# Patient Record
Sex: Female | Born: 1950 | Race: White | Hispanic: No | Marital: Married | Smoking: Former smoker
Health system: Southern US, Community
[De-identification: ages and names within clinical notes are randomized; demographics above are authoritative.]

## PROBLEM LIST (undated history)

## (undated) DIAGNOSIS — F329 Major depressive disorder, single episode, unspecified: Secondary | ICD-10-CM

## (undated) DIAGNOSIS — Z933 Colostomy status: Secondary | ICD-10-CM

## (undated) DIAGNOSIS — S82143A Displaced bicondylar fracture of unspecified tibia, initial encounter for closed fracture: Secondary | ICD-10-CM

## (undated) DIAGNOSIS — L719 Rosacea, unspecified: Secondary | ICD-10-CM

## (undated) DIAGNOSIS — G35 Multiple sclerosis: Secondary | ICD-10-CM

## (undated) DIAGNOSIS — S82109A Unspecified fracture of upper end of unspecified tibia, initial encounter for closed fracture: Secondary | ICD-10-CM

## (undated) DIAGNOSIS — R0602 Shortness of breath: Secondary | ICD-10-CM

## (undated) DIAGNOSIS — C801 Malignant (primary) neoplasm, unspecified: Secondary | ICD-10-CM

## (undated) DIAGNOSIS — Z9359 Other cystostomy status: Secondary | ICD-10-CM

## (undated) DIAGNOSIS — G830 Diplegia of upper limbs: Secondary | ICD-10-CM

## (undated) DIAGNOSIS — N210 Calculus in bladder: Secondary | ICD-10-CM

## (undated) DIAGNOSIS — E678 Other specified hyperalimentation: Secondary | ICD-10-CM

## (undated) DIAGNOSIS — IMO0001 Reserved for inherently not codable concepts without codable children: Secondary | ICD-10-CM

## (undated) DIAGNOSIS — I739 Peripheral vascular disease, unspecified: Secondary | ICD-10-CM

## (undated) DIAGNOSIS — M1990 Unspecified osteoarthritis, unspecified site: Secondary | ICD-10-CM

## (undated) DIAGNOSIS — G4730 Sleep apnea, unspecified: Secondary | ICD-10-CM

## (undated) DIAGNOSIS — M810 Age-related osteoporosis without current pathological fracture: Secondary | ICD-10-CM

## (undated) DIAGNOSIS — E785 Hyperlipidemia, unspecified: Secondary | ICD-10-CM

## (undated) DIAGNOSIS — H5713 Ocular pain, bilateral: Secondary | ICD-10-CM

## (undated) DIAGNOSIS — R3129 Other microscopic hematuria: Secondary | ICD-10-CM

## (undated) DIAGNOSIS — R609 Edema, unspecified: Secondary | ICD-10-CM

## (undated) DIAGNOSIS — N390 Urinary tract infection, site not specified: Secondary | ICD-10-CM

## (undated) DIAGNOSIS — F32A Depression, unspecified: Secondary | ICD-10-CM

## (undated) DIAGNOSIS — R110 Nausea: Secondary | ICD-10-CM

## (undated) DIAGNOSIS — G4700 Insomnia, unspecified: Secondary | ICD-10-CM

## (undated) DIAGNOSIS — E119 Type 2 diabetes mellitus without complications: Secondary | ICD-10-CM

## (undated) DIAGNOSIS — G8250 Quadriplegia, unspecified: Secondary | ICD-10-CM

## (undated) HISTORY — DX: Displaced bicondylar fracture of unspecified tibia, initial encounter for closed fracture: S82.143A

## (undated) HISTORY — DX: Multiple sclerosis: G35

## (undated) HISTORY — DX: Other microscopic hematuria: R31.29

## (undated) HISTORY — PX: TUBAL LIGATION: SHX77

## (undated) HISTORY — DX: Other specified hyperalimentation: E67.8

## (undated) HISTORY — DX: Unspecified fracture of upper end of unspecified tibia, initial encounter for closed fracture: S82.109A

## (undated) HISTORY — DX: Reserved for inherently not codable concepts without codable children: IMO0001

## (undated) HISTORY — DX: Quadriplegia, unspecified: G82.50

## (undated) HISTORY — DX: Edema, unspecified: R60.9

## (undated) HISTORY — DX: Type 2 diabetes mellitus without complications: E11.9

## (undated) HISTORY — DX: Rosacea, unspecified: L71.9

## (undated) HISTORY — DX: Hyperlipidemia, unspecified: E78.5

---

## 1979-08-09 HISTORY — PX: GASTRIC RESTRICTION SURGERY: SHX653

## 2000-08-08 HISTORY — PX: SUPRAPUBIC CATHETER INSERTION: SUR719

## 2003-08-09 HISTORY — PX: COLOSTOMY: SHX63

## 2005-09-20 ENCOUNTER — Emergency Department: Payer: Self-pay | Admitting: Emergency Medicine

## 2005-12-15 ENCOUNTER — Emergency Department: Payer: Self-pay | Admitting: Emergency Medicine

## 2006-02-01 ENCOUNTER — Emergency Department: Payer: Self-pay | Admitting: Unknown Physician Specialty

## 2006-11-23 ENCOUNTER — Emergency Department: Payer: Self-pay | Admitting: Internal Medicine

## 2007-01-30 ENCOUNTER — Emergency Department: Payer: Self-pay | Admitting: Emergency Medicine

## 2008-03-30 ENCOUNTER — Inpatient Hospital Stay: Payer: Self-pay | Admitting: General Surgery

## 2008-03-30 ENCOUNTER — Other Ambulatory Visit: Payer: Self-pay

## 2008-05-28 ENCOUNTER — Ambulatory Visit: Payer: Self-pay | Admitting: Internal Medicine

## 2008-06-04 ENCOUNTER — Ambulatory Visit: Payer: Self-pay | Admitting: Neurology

## 2008-12-19 ENCOUNTER — Ambulatory Visit: Payer: Self-pay | Admitting: Neurology

## 2009-06-02 ENCOUNTER — Ambulatory Visit: Payer: Self-pay | Admitting: Internal Medicine

## 2010-05-21 ENCOUNTER — Inpatient Hospital Stay: Payer: Self-pay | Admitting: Orthopedic Surgery

## 2010-07-06 ENCOUNTER — Encounter: Payer: Self-pay | Admitting: Orthopedic Surgery

## 2010-09-19 ENCOUNTER — Emergency Department: Payer: Self-pay | Admitting: Emergency Medicine

## 2010-12-15 ENCOUNTER — Encounter: Payer: Self-pay | Admitting: Orthopedic Surgery

## 2011-01-07 ENCOUNTER — Encounter: Payer: Self-pay | Admitting: Orthopedic Surgery

## 2011-02-05 ENCOUNTER — Ambulatory Visit: Payer: Self-pay | Admitting: Specialist

## 2011-07-18 ENCOUNTER — Emergency Department: Payer: Self-pay | Admitting: Emergency Medicine

## 2011-11-04 ENCOUNTER — Emergency Department: Payer: Self-pay | Admitting: Emergency Medicine

## 2011-11-04 LAB — URINALYSIS, COMPLETE
Bilirubin,UR: NEGATIVE
Blood: NEGATIVE
Glucose,UR: NEGATIVE mg/dL (ref 0–75)
Ketone: NEGATIVE
Nitrite: POSITIVE
Ph: 7 (ref 4.5–8.0)
Squamous Epithelial: <1
WBC UR: 23 /HPF (ref 0–5)

## 2011-11-08 DIAGNOSIS — H04129 Dry eye syndrome of unspecified lacrimal gland: Secondary | ICD-10-CM | POA: Insufficient documentation

## 2011-11-28 ENCOUNTER — Emergency Department: Payer: Self-pay | Admitting: Emergency Medicine

## 2011-11-28 LAB — URINALYSIS, COMPLETE
Blood: NEGATIVE
Hyaline Cast: 8
Ketone: NEGATIVE
Nitrite: POSITIVE
Ph: 5 (ref 4.5–8.0)
Protein: NEGATIVE
RBC,UR: 47 /HPF (ref 0–5)
Specific Gravity: 1.008 (ref 1.003–1.030)
Squamous Epithelial: 3

## 2011-11-28 LAB — CBC
HCT: 40.4 % (ref 35.0–47.0)
HGB: 13.1 g/dL (ref 12.0–16.0)
MCH: 29.6 pg (ref 26.0–34.0)
MCV: 92 fL (ref 80–100)
Platelet: 289 10*3/uL (ref 150–440)
WBC: 12.4 10*3/uL — ABNORMAL HIGH (ref 3.6–11.0)

## 2011-11-28 LAB — COMPREHENSIVE METABOLIC PANEL
Alkaline Phosphatase: 79 U/L (ref 50–136)
Bilirubin,Total: 0.4 mg/dL (ref 0.2–1.0)
Chloride: 103 mmol/L (ref 98–107)
Co2: 25 mmol/L (ref 21–32)
Creatinine: 0.59 mg/dL — ABNORMAL LOW (ref 0.60–1.30)
EGFR (Non-African Amer.): >60
Osmolality: 282 (ref 275–301)
Potassium: 3.9 mmol/L (ref 3.5–5.1)
Total Protein: 6.6 g/dL (ref 6.4–8.2)

## 2011-11-28 LAB — WET PREP, GENITAL

## 2011-11-30 LAB — URINE CULTURE

## 2011-12-23 DIAGNOSIS — L719 Rosacea, unspecified: Secondary | ICD-10-CM

## 2011-12-23 DIAGNOSIS — H01009 Unspecified blepharitis unspecified eye, unspecified eyelid: Secondary | ICD-10-CM | POA: Insufficient documentation

## 2011-12-23 HISTORY — DX: Rosacea, unspecified: L71.9

## 2012-02-01 ENCOUNTER — Other Ambulatory Visit: Payer: Self-pay | Admitting: Urology

## 2012-02-01 ENCOUNTER — Encounter (HOSPITAL_COMMUNITY): Payer: Self-pay | Admitting: Pharmacy Technician

## 2012-02-02 ENCOUNTER — Inpatient Hospital Stay (HOSPITAL_COMMUNITY): Admission: RE | Admit: 2012-02-02 | Payer: Self-pay | Source: Ambulatory Visit

## 2012-02-02 ENCOUNTER — Encounter (HOSPITAL_COMMUNITY): Payer: Self-pay

## 2012-02-02 ENCOUNTER — Encounter (HOSPITAL_COMMUNITY)
Admission: RE | Admit: 2012-02-02 | Discharge: 2012-02-02 | Disposition: A | Discharge status: Home / Self Care | Payer: Managed Care, Other (non HMO) | Source: Ambulatory Visit | Attending: Urology | Admitting: Urology

## 2012-02-02 ENCOUNTER — Ambulatory Visit (HOSPITAL_COMMUNITY)
Admission: RE | Admit: 2012-02-02 | Discharge: 2012-02-02 | Disposition: A | Discharge status: Home / Self Care | Payer: Managed Care, Other (non HMO) | Source: Ambulatory Visit | Attending: Urology | Admitting: Urology

## 2012-02-02 DIAGNOSIS — Z01818 Encounter for other preprocedural examination: Secondary | ICD-10-CM | POA: Insufficient documentation

## 2012-02-02 DIAGNOSIS — Z01812 Encounter for preprocedural laboratory examination: Secondary | ICD-10-CM | POA: Insufficient documentation

## 2012-02-02 HISTORY — DX: Unspecified osteoarthritis, unspecified site: M19.90

## 2012-02-02 HISTORY — DX: Calculus in bladder: N21.0

## 2012-02-02 HISTORY — DX: Sleep apnea, unspecified: G47.30

## 2012-02-02 HISTORY — DX: Multiple sclerosis: G35

## 2012-02-02 HISTORY — DX: Colostomy status: Z93.3

## 2012-02-02 HISTORY — DX: Malignant (primary) neoplasm, unspecified: C80.1

## 2012-02-02 HISTORY — DX: Diplegia of upper limbs: G83.0

## 2012-02-02 HISTORY — DX: Ocular pain, bilateral: H57.13

## 2012-02-02 HISTORY — DX: Depression, unspecified: F32.A

## 2012-02-02 HISTORY — DX: Major depressive disorder, single episode, unspecified: F32.9

## 2012-02-02 HISTORY — DX: Peripheral vascular disease, unspecified: I73.9

## 2012-02-02 HISTORY — DX: Insomnia, unspecified: G47.00

## 2012-02-02 HISTORY — DX: Rosacea, unspecified: L71.9

## 2012-02-02 HISTORY — DX: Age-related osteoporosis without current pathological fracture: M81.0

## 2012-02-02 HISTORY — DX: Nausea: R11.0

## 2012-02-02 HISTORY — DX: Edema, unspecified: R60.9

## 2012-02-02 HISTORY — DX: Other cystostomy status: Z93.59

## 2012-02-02 HISTORY — DX: Shortness of breath: R06.02

## 2012-02-02 HISTORY — DX: Urinary tract infection, site not specified: N39.0

## 2012-02-02 LAB — CBC
HCT: 44.3 % (ref 36.0–46.0)
MCV: 90.2 fL (ref 78.0–100.0)
Platelets: 330 10*3/uL (ref 150–400)
RBC: 4.91 MIL/uL (ref 3.87–5.11)
WBC: 12.7 10*3/uL — ABNORMAL HIGH (ref 4.0–10.5)

## 2012-02-02 LAB — BASIC METABOLIC PANEL
BUN: 13 mg/dL (ref 6–23)
CO2: 25 mEq/L (ref 19–32)
Chloride: 99 mEq/L (ref 96–112)
Creatinine, Ser: 0.46 mg/dL — ABNORMAL LOW (ref 0.50–1.10)

## 2012-02-02 LAB — SURGICAL PCR SCREEN: Staphylococcus aureus: INVALID — AB

## 2012-02-02 NOTE — Progress Notes (Signed)
02/02/12 1427  OBSTRUCTIVE SLEEP APNEA  Have you ever been diagnosed with sleep apnea through a sleep study? No  Do you snore loudly (loud enough to be heard through closed doors)?  1  Do you often feel tired, fatigued, or sleepy during the daytime? 1  Has anyone observed you stop breathing during your sleep? 0  Do you have, or are you being treated for high blood pressure? 0  BMI more than 35 kg/m2? 1  Age over 59 years old? 1  Neck circumference greater than 40 cm/18 inches? 0  Gender: 0  Obstructive Sleep Apnea Score 4   Score 4 or greater  Updated health history;Results sent to PCP

## 2012-02-02 NOTE — Progress Notes (Signed)
02/02/12 1450  OBSTRUCTIVE SLEEP APNEA  Have you ever been diagnosed with sleep apnea through a sleep study? No  Do you snore loudly (loud enough to be heard through closed doors)?  1  Do you often feel tired, fatigued, or sleepy during the daytime? 1  Has anyone observed you stop breathing during your sleep? 0  Do you have, or are you being treated for high blood pressure? 0  BMI more than 35 kg/m2? 1  Age over 59 years old? 1  Neck circumference greater than 40 cm/18 inches? 0  Gender: 0  Obstructive Sleep Apnea Score 4   Score 4 or greater  Updated health history;Results sent to PCP

## 2012-02-02 NOTE — H&P (Signed)
History of Present Illness     Beth [name] is seen today at [facility] request.  She has been having recurrent urinary tract infections for the past 3 months.  She was treated with several antibiotics and the infections recur soon after she finishes taking the antibiotics.  She also has vaginal itching secondary to yeasts infection.  She has Beth and has had an S/P tube since 2002.  She has seen blood in her urine and has been having suprapubic and back pain.  Since she has had the S/P tube for several years I advised her to have cystoscopy.  It revealed at least 3 bladder calculi.  I explained to Beth [name] that she will probably continue to have UTI as long as she has the S/P tube.  She needs to be treated only if she is symptomatic.  She needs cystolitholapaxy to remove the bladder stones.  The procedure, risks, benefits were explained to her.  The risks include but re not limited to hemorrhage, infection, bladder injury.  She understands and wishes to proceed.   Past Medical History Problems  1. History of  Arthritis V13.4 2. History of  Depression 311 3. History of  Hypercholesterolemia 272.0 4. History of  Multiple Sclerosis 340 5. History of  Sinus Arrhythmia 427.9  Surgical History Problems  1. History of  Colostomy 2. History of  Suprapubic Cystostomy  Current Meds 1. Advil TABS; Therapy: (Recorded:24Jun2013) to 2. Ampyra 10 MG Oral Tablet Extended Release 12 Hour; Therapy: (Recorded:24Jun2013) to 3. Cranberry TABS; Therapy: (Recorded:24Jun2013) to 4. Cyclobenzaprine HCl 5 MG Oral Tablet; Therapy: (Recorded:24Jun2013) to 5. Cymbalta 60 MG Oral Capsule Delayed Release Particles; Therapy: (Recorded:24Jun2013) to 6. Evista 60 MG Oral Tablet; Therapy: (Recorded:24Jun2013) to 7. Fish Oil CAPS; Therapy: (Recorded:24Jun2013) to 8. Lasix 80 MG Oral Tablet; Therapy: (Recorded:24Jun2013) to 9. Provigil 200 MG Oral Tablet; Therapy: (Recorded:24Jun2013) to 10. Stool Softener CAPS;  Therapy: (Recorded:24Jun2013) to 11. TraZODone HCl 50 MG Oral Tablet; Therapy: (Recorded:24Jun2013) to 12. Vitamin C TABS; Therapy: (Recorded:24Jun2013) to  Allergies Medication  1. No Known Drug Allergies  Family History Problems  1. Paternal history of  Asthma V17.5 2. Family history of  Family Health Status Number Of Children 3. Family history of  Father Deceased At Age 54 4. Family history of  Mother Deceased At Age ____ 5. Maternal grandfather's history of  Transient Ischemic Attack 6. Family history of  Tuberculosis  Social History Problems    Alcohol Use   Caffeine Use   Marital History - Currently Married   Tobacco Use 305.1  Review of Systems Genitourinary, constitutional, skin, eye, otolaryngeal, hematologic/lymphatic, cardiovascular, pulmonary, endocrine, musculoskeletal, gastrointestinal, neurological and psychiatric system(s) were reviewed and pertinent findings if present are noted.  Genitourinary: dysuria and hematuria.  Gastrointestinal: nausea, abdominal pain and constipation.  Constitutional: feeling tired (fatigue).  Integumentary: skin rash/lesion and pruritus.  Hematologic/Lymphatic: a tendency to easily bruise.  Cardiovascular: leg swelling.  Respiratory: shortness of breath.  Psychiatric: depression and anxiety.    Vitals Vital Signs [Data Includes: Last 1 Day]  24Jun2013 10:47AM  BMI Calculated: 34.86 BSA Calculated: 2.17 Height: 5 ft 8 in Weight: 230 lb  Blood Pressure: 155 / 85 Temperature: 98.1 F Heart Rate: 83 Respiration: 18  Physical Exam Constitutional: Well nourished and well developed . No acute distress.  ENT:. The ears and nose are normal in appearance.  Neck: The appearance of the neck is normal and no neck mass is present.  Pulmonary: No respiratory distress and normal  respiratory rhythm and effort.  Cardiovascular: Heart rate and rhythm are normal . No peripheral edema.  Abdomen: The abdomen is soft and nontender. No  masses are palpated. No CVA tenderness. No hernias are palpable. No hepatosplenomegaly noted.  Genitourinary:  Chaperone Present: .  Examination of the external genitalia shows normal female external genitalia and no lesions. The urethra is normal in appearance and not tender. There is no urethral mass. Vaginal exam demonstrates no abnormalities. The adnexa are palpably normal. The bladder is non tender and not distended. The anus is normal on inspection. The perineum is normal on inspection.  Lymphatics: The femoral and inguinal nodes are not enlarged or tender.  Skin: Normal skin turgor, no visible rash and no visible skin lesions.  Neuro/Psych:. Mood and affect are appropriate.  Unable to move lower extremities.    Procedure  Procedure: Cystoscopy   Indication: Frequent UTI.  Informed Consent: Risks, benefits, and potential adverse events were discussed and informed consent was obtained from the patient . Specific risks including, but not limited to bleeding, infection, pain, allergic reaction etc. were explained.  Prep: The patient was prepped with betadine.  Procedure Note:  The cystoscope was passed through the cystostomy tract.  Bladder: Visulization was clear. Multiple stones were identified in the bladder. The patient tolerated the procedure well.    Assessment Assessed  1. Vaginal Candidiasis 112.1 2. Bladder Calculus 594.1 3. Multiple Sclerosis 340 4. Urinary Tract Infection 599.0  Plan Vaginal Candidiasis (112.1)  1. Fluconazole 150 MG Oral Tablet; TAKE 1 TABLET 1 TIME ONLY; Therapy: 24Jun2013 to  (Evaluate:25Jun2013)  Requested for: 24Jun2013; Last Rx:24Jun2013; Edited   Diflucan 150 mgm x 1.  She needs cystolitholapaxy.     Signatures  CC: Dr Marisue Ivan  Electronically signed by : Su Grand, M.D.; Jan 30 2012  7:00PM

## 2012-02-02 NOTE — Patient Instructions (Signed)
20 Beth [name]  02/02/2012   Your procedure is scheduled on:  02/03/12 AT 9:00 AM  Report to SHORT STAY DEPT  at 6:30  AM.  Call this number if you have problems the morning of surgery: [phone]   Remember:   Do not eat food or drink liquids AFTER MIDNIGHT    Take these medicines the morning of surgery with A SIP OF WATER: AMPYRA / EVISTA / CYMBALTA   Do not wear jewelry, make-up or nail polish.  Do not wear lotions, powders, or perfumes.   Do not shave legs or underarms 48 hrs. before surgery (men may shave face)  Do not bring valuables to the hospital.  Contacts, dentures or bridgework may not be worn into surgery.  Leave suitcase in the car. After surgery it may be brought to your room.  For patients admitted to the hospital, checkout time is 11:00 AM the day of discharge.   Patients discharged the day of surgery will not be allowed to drive home.    Special Instructions:   Please read over the following fact sheets that you were given: MRSA  Information               SHOWER WITH BETASEPT THE NIGHT BEFORE SURGERY AND THE MORNING OF SURGERY

## 2012-02-03 ENCOUNTER — Ambulatory Visit (HOSPITAL_COMMUNITY)
Admission: RE | Admit: 2012-02-03 | Discharge: 2012-02-03 | Disposition: A | Discharge status: Home / Self Care | Payer: Managed Care, Other (non HMO) | Source: Ambulatory Visit | Attending: Urology | Admitting: Urology

## 2012-02-03 ENCOUNTER — Encounter (HOSPITAL_COMMUNITY): Payer: Self-pay | Admitting: *Deleted

## 2012-02-03 ENCOUNTER — Encounter (HOSPITAL_COMMUNITY): Payer: Self-pay

## 2012-02-03 ENCOUNTER — Encounter (HOSPITAL_COMMUNITY)
Admission: RE | Disposition: A | Discharge status: Home / Self Care | Payer: Self-pay | Source: Ambulatory Visit | Attending: Urology

## 2012-02-03 ENCOUNTER — Ambulatory Visit (HOSPITAL_COMMUNITY): Payer: Managed Care, Other (non HMO) | Admitting: *Deleted

## 2012-02-03 DIAGNOSIS — E7800 Pure hypercholesterolemia, unspecified: Secondary | ICD-10-CM | POA: Insufficient documentation

## 2012-02-03 DIAGNOSIS — Z79899 Other long term (current) drug therapy: Secondary | ICD-10-CM | POA: Insufficient documentation

## 2012-02-03 DIAGNOSIS — Z01818 Encounter for other preprocedural examination: Secondary | ICD-10-CM | POA: Insufficient documentation

## 2012-02-03 DIAGNOSIS — N210 Calculus in bladder: Secondary | ICD-10-CM | POA: Insufficient documentation

## 2012-02-03 DIAGNOSIS — R0602 Shortness of breath: Secondary | ICD-10-CM | POA: Insufficient documentation

## 2012-02-03 DIAGNOSIS — N390 Urinary tract infection, site not specified: Secondary | ICD-10-CM | POA: Insufficient documentation

## 2012-02-03 DIAGNOSIS — G35 Multiple sclerosis: Secondary | ICD-10-CM | POA: Insufficient documentation

## 2012-02-03 HISTORY — PX: CYSTOSCOPY: SHX5120

## 2012-02-03 SURGERY — CYSTOSCOPY
Anesthesia: General | Wound class: Clean Contaminated

## 2012-02-03 MED ORDER — ONDANSETRON HCL 4 MG/2ML IJ SOLN
INTRAMUSCULAR | Status: DC | PRN
  Administered 2012-02-03: 4 mg via INTRAVENOUS

## 2012-02-03 MED ORDER — ACETAMINOPHEN 10 MG/ML IV SOLN
INTRAVENOUS | Status: DC | PRN
  Administered 2012-02-03: 1000 mg via INTRAVENOUS

## 2012-02-03 MED ORDER — FENTANYL CITRATE 0.05 MG/ML IJ SOLN
25.0000 ug | INTRAMUSCULAR | Status: DC | PRN
  Administered 2012-02-03: 50 ug via INTRAVENOUS

## 2012-02-03 MED ORDER — 0.9 % SODIUM CHLORIDE (POUR BTL) OPTIME
TOPICAL | Status: DC | PRN
  Administered 2012-02-03: 1000 mL

## 2012-02-03 MED ORDER — MIDAZOLAM HCL 5 MG/5ML IJ SOLN
INTRAMUSCULAR | Status: DC | PRN
  Administered 2012-02-03 (×2): 1 mg via INTRAVENOUS

## 2012-02-03 MED ORDER — CEFAZOLIN SODIUM-DEXTROSE 2-3 GM-% IV SOLR
2.0000 g | Freq: Once | INTRAVENOUS | Status: AC
  Administered 2012-02-03: 2 g via INTRAVENOUS

## 2012-02-03 MED ORDER — PROMETHAZINE HCL 25 MG/ML IJ SOLN: 6.2500 mg | INTRAMUSCULAR | Status: DC | PRN

## 2012-02-03 MED ORDER — MEPERIDINE HCL 50 MG/ML IJ SOLN: 6.2500 mg | INTRAMUSCULAR | Status: DC | PRN

## 2012-02-03 MED ORDER — ACETAMINOPHEN 10 MG/ML IV SOLN
INTRAVENOUS | Status: AC
  Filled 2012-02-03: qty 100

## 2012-02-03 MED ORDER — STERILE WATER FOR IRRIGATION IR SOLN
Status: DC | PRN
  Administered 2012-02-03: 3000 mL

## 2012-02-03 MED ORDER — FENTANYL CITRATE 0.05 MG/ML IJ SOLN
INTRAMUSCULAR | Status: AC
  Filled 2012-02-03: qty 2

## 2012-02-03 MED ORDER — LIDOCAINE HCL (CARDIAC) 20 MG/ML IV SOLN
INTRAVENOUS | Status: DC | PRN
  Administered 2012-02-03: 80 mg via INTRAVENOUS

## 2012-02-03 MED ORDER — CEFAZOLIN SODIUM-DEXTROSE 2-3 GM-% IV SOLR
INTRAVENOUS | Status: AC
  Filled 2012-02-03: qty 50

## 2012-02-03 MED ORDER — PROPOFOL 10 MG/ML IV BOLUS
INTRAVENOUS | Status: DC | PRN
  Administered 2012-02-03: 160 mg via INTRAVENOUS

## 2012-02-03 MED ORDER — LACTATED RINGERS IV SOLN: INTRAVENOUS | Status: DC

## 2012-02-03 MED ORDER — LACTATED RINGERS IV SOLN
INTRAVENOUS | Status: DC
  Administered 2012-02-03: 09:00:00 via INTRAVENOUS
  Administered 2012-02-03: 1000 mL via INTRAVENOUS

## 2012-02-03 MED ORDER — FENTANYL CITRATE 0.05 MG/ML IJ SOLN
INTRAMUSCULAR | Status: DC | PRN
  Administered 2012-02-03 (×2): 50 ug via INTRAVENOUS

## 2012-02-03 SURGICAL SUPPLY — 19 items
BAG URO CATCHER STRL LF (DRAPE) ×2 IMPLANT
BASKET ZERO TIP NITINOL 2.4FR (BASKET) ×6 IMPLANT
CATH FOLEY 2WAY SLVR  5CC 22FR (CATHETERS) ×1
CATH FOLEY 2WAY SLVR 5CC 22FR (CATHETERS) ×1 IMPLANT
CATH ROBINSON RED A/P 16FR (CATHETERS) IMPLANT
CATH URET 5FR 28IN OPEN ENDED (CATHETERS) IMPLANT
CLOTH BEACON ORANGE TIMEOUT ST (SAFETY) ×2 IMPLANT
DRAPE CAMERA CLOSED 9X96 (DRAPES) ×2 IMPLANT
GLOVE BIOGEL M 7.0 STRL (GLOVE) ×2 IMPLANT
GLOVE SURG SS PI 8.0 STRL IVOR (GLOVE) IMPLANT
GOWN PREVENTION PLUS XLARGE (GOWN DISPOSABLE) IMPLANT
GOWN STRL NON-REIN LRG LVL3 (GOWN DISPOSABLE) ×2 IMPLANT
GOWN STRL REIN XL XLG (GOWN DISPOSABLE) ×2 IMPLANT
LASER FIBER DISP (UROLOGICAL SUPPLIES) ×2 IMPLANT
MANIFOLD NEPTUNE II (INSTRUMENTS) ×2 IMPLANT
MARKER SKIN DUAL TIP RULER LAB (MISCELLANEOUS) IMPLANT
PACK CYSTO (CUSTOM PROCEDURE TRAY) ×2 IMPLANT
SYRINGE IRR TOOMEY STRL 70CC (SYRINGE) ×2 IMPLANT
TUBING CONNECTING 10 (TUBING) ×2 IMPLANT

## 2012-02-03 NOTE — Anesthesia Postprocedure Evaluation (Signed)
  Anesthesia Post-op Note  Patient: Beth [name]  Procedure(s) Performed: Procedure(s) (LRB): CYSTOSCOPY (N/A) HOLMIUM LASER APPLICATION (N/A)  Patient Location: PACU  Anesthesia Type: General  Level of Consciousness: awake and alert   Airway and Oxygen Therapy: Patient Spontanous Breathing  Post-op Pain: mild  Post-op Assessment: Post-op Vital signs reviewed, Patient's Cardiovascular Status Stable, Respiratory Function Stable, Patent Airway and No signs of Nausea or vomiting  Post-op Vital Signs: stable  Complications: No apparent anesthesia complications

## 2012-02-03 NOTE — Op Note (Signed)
Seymone Forlenza is a 61 y.o.   02/03/2012  Preop diagnosis: Bladder calculi, recurrent urinary tract infections  Postop diagnosis: Same  Procedure done: Cystoscopy, holmium laser of bladder calculi, stone extraction  Surgeon: Wendie Simmer. Tajuanna Burnett  Anesthesia: General  Indication: Patient is a 61 years old female with a history of multiple sclerosis. She has a suprapubic catheter that has been exchanged on a regular basis since 2002. For the past several months and she has been having recurrent urinary tract infections. Cystoscopy through the cystostomy site revealed 3 bladder calculi. She is scheduled today for stones extraction.  Procedure: Patient was identified by her wrist band and proper timeout was taken.  Under general anesthesia she was prepped and draped and placed in the dorsolithotomy position. The cystostomy catheter was removed. A flexible cystoscope was passed through the cystostomy. The larger bladder calculus was grasped within the wires of a Nitinol basket but could not be extracted. With a 365 microfiber holmium laser the stone was fragmented in smaller fragments and the fragments were removed. Another stone was also too large to pass through the cystostomy site. It was fragmented with the holmium laser and the fragments were removed. The smaller stone was removed with the Nitinol basket. All larger stone fragments were removed through the cystostomy. A #22 French Foley catheter was then reinserted in the bladder through the cystostomy. A cystoscope was then passed through the urethra and all the smaller stone fragments were removed with a Toomey syringe. There was no remaining stone fragment in the bladder at the end of the procedure.  EBL: Minimal  The patient tolerated the procedure well and left the OR in satisfactory condition to postanesthesia care unit.  CC: Dr. Cindra Eves

## 2012-02-03 NOTE — Discharge Instructions (Signed)
Suprapubic Catheter Replacement Care After Refer to this sheet in the next few weeks. These instructions provide you with information on caring for yourself after changing your catheter. Your caregiver may also give you more specific instructions. Call your caregiver if you have any problems or questions after you change your catheter. HOME CARE INSTRUCTIONS   Take all medicines prescribed by your caregiver. Follow the directions carefully.   Drink 8 glasses of water every day. This produces good urine flow.   Check the skin around your catheter a few times every day. Watch for redness and swelling. Look for any fluids coming out of the opening.   Do not use powder or cream around the catheter opening.   Do not take tub baths or use pools or hot tubs.   Keep all follow-up appointments.  SEEK MEDICAL CARE IF:   You leak urine.   Your skin around the catheter becomes red or sore.   Your urine flow slows down.   Your urine gets cloudy or smelly.  SEEK IMMEDIATE MEDICAL CARE IF:   You have chills, nausea, or back pain.   You have trouble changing your catheter.   Your catheter comes out.   You have blood in your urine.   You have no urine flow for 1 hour.   You have a fever.  Document Released: 04/12/2011 Document Revised: 07/14/2011 Document Reviewed: 04/12/2011 [facility] Patient Information 2012 [place], [place].Cystoscopy (Bladder Exam) Care After Refer to this sheet in the next few weeks. These discharge instructions provide you with general information on caring for yourself after you leave the hospital. Your caregiver may also give you specific instructions. Your treatment has been planned according to the most current medical practices available, but unavoidable complications sometimes occur. If you have any problems or questions after discharge, please call your caregiver. AFTER THE PROCEDURE   There may be temporary bleeding and burning with urination.   Drink enough  water and fluids to keep your urine clear or pale yellow.  FINDING OUT THE RESULTS OF YOUR TEST Not all test results are available during your visit. If your test results are not back during the visit, make an appointment with your caregiver to find out the results. Do not assume everything is normal if you have not heard from your caregiver or the medical facility. It is important for you to follow up on all of your test results. SEEK IMMEDIATE MEDICAL CARE IF:   There is an increase in blood in the urine or you are passing clots.   There is difficulty passing urine.   You develop the chills.   You have an oral temperature above 102 F (38.9 C), not controlled by medicine.   Belly (abdominal) pain develops.  Document Released: 02/11/2005 Document Revised: 07/14/2011 Document Reviewed: 12/10/2007 [facility] Patient Information 2012 [place], [place].

## 2012-02-03 NOTE — Transfer of Care (Signed)
Immediate Anesthesia Transfer of Care Note  Patient: Beth [name]  Procedure(s) Performed: Procedure(s) (LRB): CYSTOSCOPY (N/A) HOLMIUM LASER APPLICATION (N/A)  Patient Location: PACU  Anesthesia Type: General  Level of Consciousness: awake, alert  and oriented  Airway & Oxygen Therapy: Patient Spontanous Breathing and Patient connected to face mask oxygen  Post-op Assessment: Report given to PACU RN and Post -op Vital signs reviewed and stable  Post vital signs: Reviewed and stable  Complications: No apparent anesthesia complications

## 2012-02-03 NOTE — Anesthesia Preprocedure Evaluation (Signed)
Anesthesia Evaluation  Patient identified by MRN, date of birth, ID band Patient awake    Reviewed: Allergy & Precautions, H&P , NPO status , Patient's Chart, lab work & pertinent test results  Airway Mallampati: II TM Distance: >3 FB Neck ROM: Full    Dental No notable dental hx.    Pulmonary neg pulmonary ROS,  breath sounds clear to auscultation  Pulmonary exam normal       Cardiovascular negative cardio ROS  Rhythm:Regular Rate:Normal     Neuro/Psych PSYCHIATRIC DISORDERS Depression Severe MS. Unable to control extremities.   negative neurological ROS  negative psych ROS   GI/Hepatic negative GI ROS, Neg liver ROS, Difficulty swallowing secondary to MS   Endo/Other  negative endocrine ROSMorbid obesity  Renal/GU negative Renal ROS  negative genitourinary   Musculoskeletal negative musculoskeletal ROS (+)   Abdominal   Peds negative pediatric ROS (+)  Hematology negative hematology ROS (+)   Anesthesia Other Findings   Reproductive/Obstetrics negative OB ROS                           Anesthesia Physical Anesthesia Plan  ASA: III  Anesthesia Plan: General   Post-op Pain Management:    Induction: Intravenous, Rapid sequence and Cricoid pressure planned  Airway Management Planned: Oral ETT  Additional Equipment:   Intra-op Plan:   Post-operative Plan: Extubation in OR  Informed Consent: I have reviewed the patients History and Physical, chart, labs and discussed the procedure including the risks, benefits and alternatives for the proposed anesthesia with the patient or authorized representative who has indicated his/her understanding and acceptance.   Dental advisory given  Plan Discussed with: CRNA  Anesthesia Plan Comments:         Anesthesia Quick Evaluation

## 2012-02-05 LAB — MRSA CULTURE

## 2012-02-06 ENCOUNTER — Encounter (HOSPITAL_COMMUNITY): Payer: Self-pay | Admitting: Urology

## 2012-04-24 ENCOUNTER — Ambulatory Visit: Payer: Self-pay | Admitting: Family Medicine

## 2012-05-08 ENCOUNTER — Ambulatory Visit: Payer: Self-pay | Admitting: Family Medicine

## 2012-06-26 ENCOUNTER — Ambulatory Visit: Payer: Self-pay | Admitting: Family Medicine

## 2012-12-28 DIAGNOSIS — N950 Postmenopausal bleeding: Secondary | ICD-10-CM | POA: Insufficient documentation

## 2012-12-28 DIAGNOSIS — N898 Other specified noninflammatory disorders of vagina: Secondary | ICD-10-CM | POA: Insufficient documentation

## 2013-01-07 DIAGNOSIS — E119 Type 2 diabetes mellitus without complications: Secondary | ICD-10-CM

## 2013-01-07 HISTORY — DX: Type 2 diabetes mellitus without complications: E11.9

## 2013-01-09 DIAGNOSIS — R9389 Abnormal findings on diagnostic imaging of other specified body structures: Secondary | ICD-10-CM | POA: Insufficient documentation

## 2013-01-29 ENCOUNTER — Ambulatory Visit: Payer: Self-pay | Admitting: Neurology

## 2013-08-28 ENCOUNTER — Ambulatory Visit: Payer: Self-pay | Admitting: Otolaryngology

## 2013-09-04 ENCOUNTER — Encounter: Payer: Self-pay | Admitting: Otolaryngology

## 2013-09-18 ENCOUNTER — Ambulatory Visit: Payer: Self-pay | Admitting: Otolaryngology

## 2013-10-18 ENCOUNTER — Inpatient Hospital Stay: Payer: Self-pay | Admitting: Family Medicine

## 2013-10-18 LAB — BASIC METABOLIC PANEL
Anion Gap: 3 — ABNORMAL LOW (ref 7–16)
BUN: 14 mg/dL (ref 7–18)
CALCIUM: 9.4 mg/dL (ref 8.5–10.1)
CHLORIDE: 99 mmol/L (ref 98–107)
Co2: 32 mmol/L (ref 21–32)
Creatinine: 1.35 mg/dL — ABNORMAL HIGH (ref 0.60–1.30)
GFR CALC AF AMER: 49 — AB
GFR CALC NON AF AMER: 43 — AB
Glucose: 107 mg/dL — ABNORMAL HIGH (ref 65–99)
Osmolality: 269 (ref 275–301)
Potassium: 3.3 mmol/L — ABNORMAL LOW (ref 3.5–5.1)
SODIUM: 134 mmol/L — AB (ref 136–145)

## 2013-10-18 LAB — CBC
HCT: 41.2 % (ref 35.0–47.0)
HGB: 13.1 g/dL (ref 12.0–16.0)
MCH: 28.9 pg (ref 26.0–34.0)
MCHC: 31.7 g/dL — ABNORMAL LOW (ref 32.0–36.0)
MCV: 91 fL (ref 80–100)
PLATELETS: 315 10*3/uL (ref 150–440)
RBC: 4.53 10*6/uL (ref 3.80–5.20)
RDW: 13.4 % (ref 11.5–14.5)
WBC: 14.9 10*3/uL — AB (ref 3.6–11.0)

## 2013-10-18 LAB — PRO B NATRIURETIC PEPTIDE: B-TYPE NATIURETIC PEPTID: 167 pg/mL — AB (ref 0–125)

## 2013-10-18 LAB — URINALYSIS, COMPLETE
Bilirubin,UR: NEGATIVE
GLUCOSE, UR: NEGATIVE mg/dL (ref 0–75)
Ketone: NEGATIVE
NITRITE: NEGATIVE
PROTEIN: NEGATIVE
Ph: 5 (ref 4.5–8.0)
RBC,UR: 14 /HPF (ref 0–5)
SPECIFIC GRAVITY: 1.018 (ref 1.003–1.030)

## 2013-10-18 LAB — TROPONIN I
Troponin-I: <0.02 ng/mL
Troponin-I: <0.02 ng/mL

## 2013-10-18 LAB — LIPASE, BLOOD: LIPASE: 112 U/L (ref 73–393)

## 2013-10-18 LAB — HEPATIC FUNCTION PANEL A (ARMC)
ALBUMIN: 3.6 g/dL (ref 3.4–5.0)
Alkaline Phosphatase: 66 U/L
BILIRUBIN DIRECT: 0.1 mg/dL (ref 0.00–0.20)
Bilirubin,Total: 0.5 mg/dL (ref 0.2–1.0)
SGOT(AST): 16 U/L (ref 15–37)
SGPT (ALT): 21 U/L (ref 12–78)
TOTAL PROTEIN: 7.7 g/dL (ref 6.4–8.2)

## 2013-10-19 LAB — CBC WITH DIFFERENTIAL/PLATELET
BASOS ABS: 0 10*3/uL (ref 0.0–0.1)
Basophil %: 0.3 %
EOS ABS: 0.1 10*3/uL (ref 0.0–0.7)
Eosinophil %: 0.6 %
HCT: 35.5 % (ref 35.0–47.0)
HGB: 12 g/dL (ref 12.0–16.0)
Lymphocyte #: 2.3 10*3/uL (ref 1.0–3.6)
Lymphocyte %: 20.4 %
MCH: 30.6 pg (ref 26.0–34.0)
MCHC: 33.8 g/dL (ref 32.0–36.0)
MCV: 90 fL (ref 80–100)
MONOS PCT: 11.7 %
Monocyte #: 1.3 x10 3/mm — ABNORMAL HIGH (ref 0.2–0.9)
NEUTROS ABS: 7.7 10*3/uL — AB (ref 1.4–6.5)
NEUTROS PCT: 67 %
PLATELETS: 254 10*3/uL (ref 150–440)
RBC: 3.93 10*6/uL (ref 3.80–5.20)
RDW: 13.4 % (ref 11.5–14.5)
WBC: 11.5 10*3/uL — AB (ref 3.6–11.0)

## 2013-10-19 LAB — BASIC METABOLIC PANEL
ANION GAP: 8 (ref 7–16)
BUN: 13 mg/dL (ref 7–18)
CALCIUM: 8 mg/dL — AB (ref 8.5–10.1)
Chloride: 103 mmol/L (ref 98–107)
Co2: 25 mmol/L (ref 21–32)
Creatinine: 0.92 mg/dL (ref 0.60–1.30)
Glucose: 86 mg/dL (ref 65–99)
OSMOLALITY: 271 (ref 275–301)
POTASSIUM: 3.2 mmol/L — AB (ref 3.5–5.1)
SODIUM: 136 mmol/L (ref 136–145)

## 2013-10-20 LAB — CBC WITH DIFFERENTIAL/PLATELET
BASOS ABS: 0 10*3/uL (ref 0.0–0.1)
BASOS PCT: 0.2 %
Eosinophil #: 0 10*3/uL (ref 0.0–0.7)
Eosinophil %: 0.3 %
HCT: 35.9 % (ref 35.0–47.0)
HGB: 11.6 g/dL — ABNORMAL LOW (ref 12.0–16.0)
LYMPHS PCT: 11.9 %
Lymphocyte #: 2.2 10*3/uL (ref 1.0–3.6)
MCH: 29.6 pg (ref 26.0–34.0)
MCHC: 32.3 g/dL (ref 32.0–36.0)
MCV: 92 fL (ref 80–100)
MONOS PCT: 11.3 %
Monocyte #: 2.1 x10 3/mm — ABNORMAL HIGH (ref 0.2–0.9)
NEUTROS PCT: 76.3 %
Neutrophil #: 13.9 10*3/uL — ABNORMAL HIGH (ref 1.4–6.5)
Platelet: 271 10*3/uL (ref 150–440)
RBC: 3.92 10*6/uL (ref 3.80–5.20)
RDW: 13.3 % (ref 11.5–14.5)
WBC: 18.2 10*3/uL — ABNORMAL HIGH (ref 3.6–11.0)

## 2013-10-20 LAB — BASIC METABOLIC PANEL
ANION GAP: 6 — AB (ref 7–16)
BUN: 7 mg/dL (ref 7–18)
CALCIUM: 8.4 mg/dL — AB (ref 8.5–10.1)
CHLORIDE: 107 mmol/L (ref 98–107)
CO2: 25 mmol/L (ref 21–32)
CREATININE: 0.86 mg/dL (ref 0.60–1.30)
GLUCOSE: 136 mg/dL — AB (ref 65–99)
OSMOLALITY: 276 (ref 275–301)
Potassium: 3.5 mmol/L (ref 3.5–5.1)
Sodium: 138 mmol/L (ref 136–145)

## 2013-10-21 LAB — COMPREHENSIVE METABOLIC PANEL
ALT: 16 U/L (ref 12–78)
ANION GAP: 6 — AB (ref 7–16)
Albumin: 2.1 g/dL — ABNORMAL LOW (ref 3.4–5.0)
Alkaline Phosphatase: 55 U/L
BILIRUBIN TOTAL: 0.2 mg/dL (ref 0.2–1.0)
BUN: 8 mg/dL (ref 7–18)
CALCIUM: 8.4 mg/dL — AB (ref 8.5–10.1)
CHLORIDE: 111 mmol/L — AB (ref 98–107)
Co2: 22 mmol/L (ref 21–32)
Creatinine: 0.77 mg/dL (ref 0.60–1.30)
Glucose: 186 mg/dL — ABNORMAL HIGH (ref 65–99)
Osmolality: 281 (ref 275–301)
POTASSIUM: 4.3 mmol/L (ref 3.5–5.1)
SGOT(AST): 11 U/L — ABNORMAL LOW (ref 15–37)
SODIUM: 139 mmol/L (ref 136–145)
Total Protein: 5.9 g/dL — ABNORMAL LOW (ref 6.4–8.2)

## 2013-10-21 LAB — CBC WITH DIFFERENTIAL/PLATELET
BASOS PCT: 0.1 %
Basophil #: 0 10*3/uL (ref 0.0–0.1)
EOS ABS: 0 10*3/uL (ref 0.0–0.7)
Eosinophil %: 0 %
HCT: 33.2 % — AB (ref 35.0–47.0)
HGB: 11.1 g/dL — ABNORMAL LOW (ref 12.0–16.0)
Lymphocyte #: 1.1 10*3/uL (ref 1.0–3.6)
Lymphocyte %: 7.4 %
MCH: 30.5 pg (ref 26.0–34.0)
MCHC: 33.4 g/dL (ref 32.0–36.0)
MCV: 91 fL (ref 80–100)
MONOS PCT: 6.4 %
Monocyte #: 1 x10 3/mm — ABNORMAL HIGH (ref 0.2–0.9)
NEUTROS ABS: 13.3 10*3/uL — AB (ref 1.4–6.5)
NEUTROS PCT: 86.1 %
Platelet: 264 10*3/uL (ref 150–440)
RBC: 3.65 10*6/uL — ABNORMAL LOW (ref 3.80–5.20)
RDW: 13.2 % (ref 11.5–14.5)
WBC: 15.4 10*3/uL — AB (ref 3.6–11.0)

## 2013-10-22 LAB — CBC WITH DIFFERENTIAL/PLATELET
BASOS PCT: 0 %
Basophil #: 0 10*3/uL (ref 0.0–0.1)
EOS ABS: 0 10*3/uL (ref 0.0–0.7)
Eosinophil %: 0 %
HCT: 32.2 % — ABNORMAL LOW (ref 35.0–47.0)
HGB: 11 g/dL — AB (ref 12.0–16.0)
LYMPHS ABS: 1.4 10*3/uL (ref 1.0–3.6)
Lymphocyte %: 9.6 %
MCH: 31.2 pg (ref 26.0–34.0)
MCHC: 34.3 g/dL (ref 32.0–36.0)
MCV: 91 fL (ref 80–100)
Monocyte #: 1.1 x10 3/mm — ABNORMAL HIGH (ref 0.2–0.9)
Monocyte %: 7.7 %
NEUTROS ABS: 12.4 10*3/uL — AB (ref 1.4–6.5)
NEUTROS PCT: 82.7 %
Platelet: 297 10*3/uL (ref 150–440)
RBC: 3.53 10*6/uL — AB (ref 3.80–5.20)
RDW: 13.3 % (ref 11.5–14.5)
WBC: 15 10*3/uL — AB (ref 3.6–11.0)

## 2013-10-22 LAB — BASIC METABOLIC PANEL
Anion Gap: 4 — ABNORMAL LOW (ref 7–16)
BUN: 10 mg/dL (ref 7–18)
CHLORIDE: 112 mmol/L — AB (ref 98–107)
Calcium, Total: 8.6 mg/dL (ref 8.5–10.1)
Co2: 24 mmol/L (ref 21–32)
Creatinine: 0.85 mg/dL (ref 0.60–1.30)
GLUCOSE: 213 mg/dL — AB (ref 65–99)
OSMOLALITY: 285 (ref 275–301)
Potassium: 4.6 mmol/L (ref 3.5–5.1)
SODIUM: 140 mmol/L (ref 136–145)

## 2013-10-23 LAB — CULTURE, BLOOD (SINGLE)

## 2013-10-25 LAB — CULTURE, BLOOD (SINGLE)

## 2013-11-21 LAB — URINE CULTURE

## 2013-12-09 ENCOUNTER — Emergency Department: Payer: Self-pay | Admitting: Emergency Medicine

## 2013-12-09 LAB — COMPREHENSIVE METABOLIC PANEL
ALK PHOS: 90 U/L
AST: 30 U/L (ref 15–37)
Albumin: 2.2 g/dL — ABNORMAL LOW (ref 3.4–5.0)
Anion Gap: 8 (ref 7–16)
BUN: 13 mg/dL (ref 7–18)
Bilirubin,Total: 0.3 mg/dL (ref 0.2–1.0)
CALCIUM: 8.8 mg/dL (ref 8.5–10.1)
Chloride: 99 mmol/L (ref 98–107)
Co2: 30 mmol/L (ref 21–32)
Creatinine: 0.83 mg/dL (ref 0.60–1.30)
EGFR (African American): >60
Glucose: 91 mg/dL (ref 65–99)
Osmolality: 274 (ref 275–301)
POTASSIUM: 3.2 mmol/L — AB (ref 3.5–5.1)
SGPT (ALT): 19 U/L (ref 12–78)
Sodium: 137 mmol/L (ref 136–145)
Total Protein: 6.6 g/dL (ref 6.4–8.2)

## 2013-12-09 LAB — PROTIME-INR
INR: 1.1
Prothrombin Time: 14.5 secs (ref 11.5–14.7)

## 2013-12-09 LAB — CBC WITH DIFFERENTIAL/PLATELET
BASOS ABS: 0.2 10*3/uL — AB (ref 0.0–0.1)
Basophil %: 1.1 %
EOS ABS: 0.1 10*3/uL (ref 0.0–0.7)
EOS PCT: 0.6 %
HCT: 35.3 % (ref 35.0–47.0)
HGB: 11.5 g/dL — AB (ref 12.0–16.0)
LYMPHS ABS: 4 10*3/uL — AB (ref 1.0–3.6)
LYMPHS PCT: 23.6 %
MCH: 29.4 pg (ref 26.0–34.0)
MCHC: 32.6 g/dL (ref 32.0–36.0)
MCV: 90 fL (ref 80–100)
MONO ABS: 1.3 x10 3/mm — AB (ref 0.2–0.9)
Monocyte %: 7.9 %
NEUTROS ABS: 11.2 10*3/uL — AB (ref 1.4–6.5)
Neutrophil %: 66.8 %
Platelet: 672 10*3/uL — ABNORMAL HIGH (ref 150–440)
RBC: 3.92 10*6/uL (ref 3.80–5.20)
RDW: 14.5 % (ref 11.5–14.5)
WBC: 16.8 10*3/uL — ABNORMAL HIGH (ref 3.6–11.0)

## 2013-12-09 LAB — URINALYSIS, COMPLETE
BILIRUBIN, UR: NEGATIVE
Blood: NEGATIVE
Glucose,UR: NEGATIVE mg/dL (ref 0–75)
Ketone: NEGATIVE
NITRITE: NEGATIVE
Ph: 6 (ref 4.5–8.0)
Protein: NEGATIVE
RBC,UR: <1 /HPF (ref 0–5)
Specific Gravity: 1.006 (ref 1.003–1.030)
Squamous Epithelial: 4

## 2013-12-09 LAB — TROPONIN I: Troponin-I: <0.02 ng/mL

## 2013-12-09 LAB — APTT: Activated PTT: 33.1 secs (ref 23.6–35.9)

## 2013-12-11 LAB — URINE CULTURE

## 2013-12-12 DIAGNOSIS — F329 Major depressive disorder, single episode, unspecified: Secondary | ICD-10-CM | POA: Insufficient documentation

## 2013-12-12 DIAGNOSIS — R0789 Other chest pain: Secondary | ICD-10-CM | POA: Insufficient documentation

## 2013-12-12 DIAGNOSIS — F419 Anxiety disorder, unspecified: Secondary | ICD-10-CM | POA: Insufficient documentation

## 2013-12-12 DIAGNOSIS — IMO0001 Reserved for inherently not codable concepts without codable children: Secondary | ICD-10-CM

## 2013-12-12 DIAGNOSIS — G35 Multiple sclerosis: Secondary | ICD-10-CM | POA: Insufficient documentation

## 2013-12-12 DIAGNOSIS — R5383 Other fatigue: Secondary | ICD-10-CM | POA: Insufficient documentation

## 2013-12-12 DIAGNOSIS — F331 Major depressive disorder, recurrent, moderate: Secondary | ICD-10-CM | POA: Insufficient documentation

## 2013-12-12 DIAGNOSIS — R109 Unspecified abdominal pain: Secondary | ICD-10-CM | POA: Insufficient documentation

## 2013-12-12 DIAGNOSIS — R0602 Shortness of breath: Secondary | ICD-10-CM | POA: Insufficient documentation

## 2013-12-12 DIAGNOSIS — F32A Depression, unspecified: Secondary | ICD-10-CM | POA: Insufficient documentation

## 2013-12-12 HISTORY — DX: Reserved for inherently not codable concepts without codable children: IMO0001

## 2013-12-12 HISTORY — DX: Multiple sclerosis: G35

## 2014-01-16 ENCOUNTER — Ambulatory Visit: Payer: Self-pay | Admitting: Gastroenterology

## 2014-01-17 LAB — PATHOLOGY REPORT

## 2014-02-03 DIAGNOSIS — E7800 Pure hypercholesterolemia, unspecified: Secondary | ICD-10-CM | POA: Insufficient documentation

## 2014-02-03 DIAGNOSIS — B351 Tinea unguium: Secondary | ICD-10-CM | POA: Insufficient documentation

## 2014-08-13 DIAGNOSIS — S82109A Unspecified fracture of upper end of unspecified tibia, initial encounter for closed fracture: Secondary | ICD-10-CM | POA: Insufficient documentation

## 2014-08-13 HISTORY — DX: Unspecified fracture of upper end of unspecified tibia, initial encounter for closed fracture: S82.109A

## 2014-11-06 DIAGNOSIS — S82143A Displaced bicondylar fracture of unspecified tibia, initial encounter for closed fracture: Secondary | ICD-10-CM

## 2014-11-06 HISTORY — DX: Displaced bicondylar fracture of unspecified tibia, initial encounter for closed fracture: S82.143A

## 2014-11-29 NOTE — H&P (Signed)
PATIENT NAMENANCE, [name] MR#:  [id number] DATE OF BIRTH:  Sep 19, 1950  DATE OF ADMISSION:  10/18/2013  PRIMARY CARE PHYSICIAN:  Dr. Dion [name].    REFERRING PHYSICIAN:  Dr. Ponciano [name]   CHIEF COMPLAINT:   Chest pain and hypotension.   HISTORY OF PRESENT ILLNESS: The patient is a 64 year old female with a known history of multiple sclerosis, obstructive sleep apnea. He is being admitted for SIRS thought to be secondary to UTI. The patient started having earache yesterday, which was radiating to neck and finally went to chest area. It was mainly in her right breast and was unbearable, was throbbing in nature, and decided to come to the Emergency Department. While in the ED, she was found to be hypotensive with pressure in 70s. She was given morphine IV for her pain, as her pain was unbearable. She also received 25 mcg of fentanyl along with 4 mg of morphine. Her pressure initially was 100/60s and dropped in to 70s, required 2 liters of bolus, and she is being admitted for further evaluation and management.   PAST MEDICAL HISTORY:   1.  Multiple sclerosis with bedbound status.  2.  Status post ileostomy and colostomy.  3.  Anxiety. 4.  Depression.  5.  Obstructive sleep apnea.   ALLERGIES: No known drug allergies.   SOCIAL HISTORY: Former smoker. No alcohol use. No illicit drug use. She lives at home with her husband.   FAMILY HISTORY: Father died from complication of heart attack in the 52s.   MEDICATIONS AT HOME:   1.  Colace 100 mg p.o. daily.  2.  Cyclobenzaprine 5 mg p.o. 3 times a day as needed.  3.  Cymbalta 60 mg p.o. daily.  4.  Evista 60 mg p.o. daily.  5.  Fish oil 1000 mg p.o. daily.  6.  Fluconazole 150 mg p.o. once a week.  7.  Lasix 80 mg p.o. daily.  8.  Gabapentin 300 mg p.o. 3 times a day as needed.  9.  Metformin 500 mg p.o. daily.  10.  Multivitamin once daily.  11.  Pravastatin  20 mg p.o. at bedtime.  12.  Restasis 0.05% ophthalmic drop to each  affected eye twice a day.  13.  Trazodone 50 mg p.o. 4 tablets once at bedtime.  14.  Vitamin C 1000 mg p.o. daily.   REVIEW OF SYSTEMS:  CONSTITUTIONAL: No fever, fatigue, weakness. Is having significant pain in the chest.  EYES:  No blurred or double vision. ENT: No tinnitus, ear pain.  RESPIRATORY: No cough, wheezing, hemoptysis.  CARDIOVASCULAR: Positive for chest pain. No orthopnea or edema.  GASTROINTESTINAL: No nausea, vomiting, diarrhea.  GENITOURINARY:   No dysuria or hematuria. ENDOCRINE: No polyuria or nocturia. HEMATOLOGIC AND LYMPHATICS:  No anemia or easy bruising.  SKIN: No rash or lesion.  MUSCULOSKELETAL: A lot of neck pain and chest pain, seems more muscular in nature.  NEUROLOGIC: Positive for history of multiple sclerosis.  PSYCHIATRIC:  No anxiety or depression.   PHYSICAL EXAMINATION: VITAL SIGNS: Temperature 98.2, heart rate 88 per minute, respirations 20 per minute. Blood pressure initially was 106/65 but dropped in systolic 17G to 01V after given morphine and fentanyl. She is saturating 97% on room.  GENERAL:  The patient is a 64 year old female lying in bed comfortably without any acute distress.  EYES: Pupils equal, round and reactive to light and accommodation. No scleral icterus.  Extraocular muscles intact.  HENT:  Atraumatic, normocephalic.  Oropharynx and nasopharynx clear. NECK:  Supple. No jugular venous distension.  No thyroid enlargement. No tenderness. LUNGS: Clear to auscultation bilaterally. No wheezing, rales, rhonchi or crepitation.  CARDIOVASCULAR:  S1, S2  normal.  No murmurs, gallops or rubs. ABDOMEN: Soft, nontender, nondistended. Bowel sounds present. No organomegaly or mass.  EXTREMITIES: No pedal edema, cyanosis or clubbing.  NEUROLOGICAL: Nonfocal examination. Cranial nerves III through XII intact. 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  She is globally weak.  Sensation is otherwise intact.  Chest x-ray in the ED showed no  acute cardiopulmonary disease.   CT scan of the chest, abdomen and pelvis was essentially negative except some anterior pelvic wall hernia containing a loop of small bowel without evidence of incarceration or obstruction. 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 There was no PE.   LABORATORY DATA: Normal BMP except sodium 134, potassium 3.3, BUN 14, creatinine 1.35. Normal liver function tests. Normal first set of cardiac enzymes. Normal CBC, except white count of 14.9. UA showed 355 WBCs, 3+ bacteria, 3+ leuk esterase and WBC in clumps present.   EKG showed normal sinus rhythm. No acute ST-T changes.  IMPRESSION AND PLAN: 1.  Systemic inflammatory response syndrome with leukocytosis, hypotension, likely secondary to urinary source. She was started on IV Rocephin.  2.  Urinary tract infection per urinalysis.  We will get urine culture and sensitivity. We will start her on IV Rocephin. This certainly could be colonization, although considering her hypotension and leukocytosis, we certainly need to treat her as possible UTI.  3.  Acute renal failure, likely prerenal. We will hydrate with IV fluids and monitor renal function and avoid any nephrotoxins.  4.  Hypokalemia. We will replete and recheck  5.  Chest pain. This is likely muscular in nature. We will try to avoid narcotics if at all possible. We will try Tylenol for the time being.   CODE STATUS: FULL CODE.   Total time taking care of this patient is 55 minutes.     ____________________________ Beth [name]. Beth [name] vss:dmm D: 10/18/2013 18:03:23 ET T: 10/18/2013 19:46:53 ET JOB#: [id number]  cc: Beth Yoon S. Beth [name], <Dictator> Beth Body, [name] Beth George's [name] ELECTRONICALLY SIGNED 10/20/2013 22:59

## 2014-11-29 NOTE — Discharge Summary (Signed)
PATIENT NAMEANDREKA, Beth [name] MR#:  [id number] DATE OF BIRTH:  10/18/1950  DATE OF ADMISSION:  10/18/2013 DATE OF DISCHARGE:  10/22/2013  DISCHARGE DIAGNOSES: 1.  Systemic antiinflammatory response syndrome with underlying urinary tract infection.  2.  Multiple sclerosis flare.   DISCHARGE MEDICATIONS: 1.  Fish oil 1000 mg p.o. daily.  2.  Vitamin C 1000 mg p.o. daily.  3.  Colace 100 mg p.o. daily.  4.  Furosemide 80 mg p.o. daily.  5.  Cymbalta 60 mg p.o. daily.  6.  Trazodone 50 mg 4 tabs p.o. at bedtime.  7.  Restasis 0.05% ophthalmic emulsion 1 drop to each affected eye b.i.d.  8.  Gabapentin 300 mg p.o. t.i.d. as needed.  9.  Cyclobenzaprine 5 mg p.o. t.i.d. as needed.  10.  Pravastatin 20 mg p.o. daily.  11.  Multivitamin 1 tab p.o. daily.  12.  Fluconazole 150 mg p.o. weekly.  13.  Bactrim DS 1 tab p.o. b.i.d.  14.  Prednisone taper as directed.  15.  Levaquin 500 mg p.o. daily x10 more days.   CONSULTANTS: None.  PROCEDURES: Her suprapubic catheter was changed out.   PERTINENT LABORATORY AND STUDIES: On day of discharge, sodium 140, potassium 4.6, creatinine 0.85, glucose 213. White blood cell count 15, hemoglobin 11, and platelets 297,000. Urine culture grew out Klebsiella E. coli, staph and strep sensitive to Levaquin. Sensitivity to the MRSA is still pending at this time.   BRIEF HOSPITAL COURSE:  1.  SIRS with underlying UTI. The patient initially came in with encephalopathy and changes in her mental status. She was also found to have elevated white blood cell count consistent with diagnosis of SIRS. UA confirmed a UTI and the cultures grew out significant bacteria of different species. She does have a suprapubic catheter which was leaking and there was concern about that. The catheter was replaced. She was started on ceftriaxone and Levaquin was added. She was feeling better with the treatment of her underlying UTI. Once sensitivities were available, she was switched  over to vanc for a period, but on discharge she was switched over to Bactrim for the coverage of MRSA and Levaquin for the other species. I will follow up as an outpatient. We will reassess the sensitivities from the culture later on this week.  2.  Multiple sclerosis flare. The patient states that she has had symptoms like this in the past where she has pain of the shoulders and radiating numbness consistent with multiple sclerosis. She was started on steroid therapy and her symptoms improved. Will continue with the steroid Dosepak for her.  3.  Other chronic issues are stable. Continue with home regimen.   DISPOSITION: She is in stable condition and will be discharged to home. She has a personal caretaker at home who manages her medical issues and her meds. She will follow up with Dr. Netty Starring within 10 days. ____________________________ Dion Body, MD kl:sb D: 10/22/2013 13:30:06 ET T: 10/22/2013 16:33:49 ET JOB#: [id number]  cc: Dion Body, MD, <Dictator> Dion Body MD ELECTRONICALLY SIGNED 11/05/2013 15:21

## 2014-12-10 DIAGNOSIS — G8250 Quadriplegia, unspecified: Secondary | ICD-10-CM | POA: Insufficient documentation

## 2014-12-10 DIAGNOSIS — N3289 Other specified disorders of bladder: Secondary | ICD-10-CM | POA: Insufficient documentation

## 2014-12-10 HISTORY — DX: Quadriplegia, unspecified: G82.50

## 2015-01-16 ENCOUNTER — Ambulatory Visit (INDEPENDENT_AMBULATORY_CARE_PROVIDER_SITE_OTHER): Payer: Commercial Managed Care - HMO | Admitting: Urology

## 2015-01-16 ENCOUNTER — Encounter: Payer: Self-pay | Admitting: Urology

## 2015-01-16 VITALS — BP 120/79 | HR 71 | Ht 68.0 in | Wt 220.0 lb

## 2015-01-16 DIAGNOSIS — N319 Neuromuscular dysfunction of bladder, unspecified: Secondary | ICD-10-CM | POA: Diagnosis not present

## 2015-01-16 DIAGNOSIS — T8351XA Infection and inflammatory reaction due to indwelling urinary catheter, initial encounter: Secondary | ICD-10-CM

## 2015-01-16 DIAGNOSIS — T83511A Infection and inflammatory reaction due to indwelling urethral catheter, initial encounter: Principal | ICD-10-CM

## 2015-01-16 DIAGNOSIS — N390 Urinary tract infection, site not specified: Secondary | ICD-10-CM

## 2015-01-16 LAB — URINALYSIS, COMPLETE
BILIRUBIN UA: NEGATIVE
GLUCOSE, UA: NEGATIVE
KETONES UA: NEGATIVE
Nitrite, UA: POSITIVE — AB
Specific Gravity, UA: 1.02 (ref 1.005–1.030)
Urobilinogen, Ur: 0.2 mg/dL (ref 0.2–1.0)
pH, UA: 5.5 (ref 5.0–7.5)

## 2015-01-16 LAB — MICROSCOPIC EXAMINATION

## 2015-01-16 NOTE — Progress Notes (Signed)
01/16/2015 3:55 PM   Beth [name] 05-22-1951 [id number]  Referring provider: Dion Body, MD [facility] [place], [place] [address]  Chief Complaint  Patient presents with  . UTI    New Patient- SP tube    HPI:  64 yo F with MS who presents today to establish care of [place].  She has a history of neurogenic bladder secondary to MS s/p SPT ~10 years ago.  She was previously followed by Dr.Nesi at [facility].  Records have been requested today.    She developed bladder stones in 2013 s/p cystolithalopaxy.    Her husband changes her SPT monthly.    She currently think that she has cloudy urine, backaches, foul odor and leakage from her ureteral.  She has also had some chills but no fevers.  This has been going on for a few weeks, possibly even 1-2 months.   At baseline, she denies any significant urinary leakage per urethra around the tube. No history of bladder spasms which are significant and unless related to an infection.  She reports having infections approximately twice per year.  She was previously on cranberry tabs but has not taken them in a few weeks because she thinks its turning her bag purple.    No recent cystoscopy.    She has signed a release for her records at [facility].     PMH: Past Medical History  Diagnosis Date  . Shortness of breath     DIFFICULTY EXPECTORATING PHLEM  . Peripheral vascular disease   . MS (multiple sclerosis)     SINCE 1990  . Paralysis of both upper limbs     AND LOWER EXTREMITIES DUE TO MS  . Arthritis     L HAND  . Osteoporosis   . Rosacea   . Colostomy in place   . Nausea   . Bladder stone   . Suprapubic catheter   . UTI (lower urinary tract infection)     CHRONIC SINCE MARCH 2013  . Insomnia     TAKES SLEEPING MED NIGHTLY  . Depression   . Cancer     HX OF SKIN CANCER  . Pain of both eyes   . Swelling     FEET AND HANDS  . Sleep apnea      STOP BANG SCORE 4    Surgical History: Past Surgical History  Procedure Laterality Date  . Suprapubic catheter insertion  2002  . Colostomy  2005  . Gastric restriction surgery  1981  . Tubal ligation    . Cystoscopy  02/03/2012    Procedure: CYSTOSCOPY;  Surgeon: Hanley Ben, MD;  Location: [facility];  Service: Urology;  Laterality: N/A;  Cystoscopy, Holmium Laser of Bladder Laser    Home Medications:    Medication List       This list is accurate as of: 01/16/15 11:59 PM.  Always use your most recent med list.               AMPYRA 10 MG Tb12  Generic drug:  dalfampridine  Take 10 mg by mouth 2 (two) times daily.     Cranberry 250 MG Caps  Take 1 capsule by mouth 2 (two) times daily.     cyclobenzaprine 5 MG tablet  Commonly known as:  FLEXERIL  Take 5 mg by mouth 3 (three) times daily as needed. For muscle spasms     docusate sodium 100 MG capsule  Commonly known  as:  COLACE  Take 100 mg by mouth daily.     DULoxetine 30 MG capsule  Commonly known as:  CYMBALTA     DULoxetine 60 MG capsule  Commonly known as:  CYMBALTA  Take 60 mg by mouth daily with breakfast.     fish oil-omega-3 fatty acids 1000 MG capsule  Take 2 g by mouth daily with breakfast.     furosemide 80 MG tablet  Commonly known as:  LASIX  Take 80 mg by mouth daily with breakfast.     ibuprofen 200 MG tablet  Commonly known as:  ADVIL,MOTRIN  Take 400 mg by mouth every 6 (six) hours as needed. For pain     pravastatin 20 MG tablet  Commonly known as:  PRAVACHOL     PRESCRIPTION MEDICATION  Place into both eyes 2 (two) times daily. Patient gets special eye drop made at [facility] with own blood called Blood Serum eye drops. 1 drop in both eyes twice a day.     raloxifene 60 MG tablet  Commonly known as:  EVISTA  Take 60 mg by mouth daily with breakfast.     RESTASIS 0.05 % ophthalmic emulsion  Generic drug:  cycloSPORINE     traZODone 50 MG tablet  Commonly known as:   DESYREL  Take 100 mg by mouth at bedtime.     vitamin C 1000 MG tablet  Take 1,000 mg by mouth daily.        Allergies: No Known Allergies  Family History: Family History  Problem Relation Age of Onset  . Heart attack Father   . Stroke Father   . Diabetes Father     Social History:  reports that she quit smoking about 10 years ago. She does not have any smokeless tobacco history on file. She reports that she drinks alcohol. She reports that she does not use illicit drugs.  ROS: Urological Symptom Review  Patient is experiencing the following symptoms: Urinary tract infection   Review of Systems  Gastrointestinal (upper)  : Negative for upper GI symptoms  Gastrointestinal (lower) : Negative for lower GI symptoms  Constitutional : Fatigue  Skin: Negative for skin symptoms  Eyes: Negative for eye symptoms  Ear/Nose/Throat : Negative for Ear/Nose/Throat symptoms  Hematologic/Lymphatic: Negative for Hematologic/Lymphatic symptoms  Cardiovascular : Leg swelling  Respiratory : Shortness of breath  Endocrine: Negative for endocrine symptoms  Musculoskeletal: Back pain  Neurological: Negative for neurological symptoms  Psychologic: Depression Anxiety   Physical Exam: BP 120/79 mmHg  Pulse 71  Ht 5\' 8"  (1.727 m)  Wt 220 lb (99.791 kg)  BMI 33.46 kg/m2  Constitutional:  Alert and oriented, No acute distress.  Presents with husband today.  In wheelchair. HEENT: [place] AT, moist mucus membranes.  Trachea midline, no masses. Cardiovascular: Bilateral lower extremity edema with chronic stasis dermatitis. Respiratory: Normal respiratory effort, no increased work of breathing. GI: Abdomen is soft, nontender, nondistended, no abdominal masses.  Obese. Colostomy in place. GU: No CVA tenderness. SP tube site clean dry and intact with small amount of granulation tissue around the tubing. Catheter draining slightly cloudy urine. Skin: No rashes, bruises or  suspicious lesions. Lymph: No cervical or inguinal adenopathy. Neurologic: Grossly intact, no focal deficits, moving all 4 extremities.  Upper extremity atrophy bilaterally noted Psychiatric: Normal mood and affect.  Laboratory Data: Lab Results  Component Value Date   WBC 16.8* 12/09/2013   HGB 11.5* 12/09/2013   HCT 35.3 12/09/2013   MCV 90 12/09/2013  PLT 672* 12/09/2013    Lab Results  Component Value Date   CREATININE 0.83 12/09/2013   Urinalysis Results for orders placed or performed in visit on 01/16/15  CULTURE, URINE COMPREHENSIVE  Result Value Ref Range   Result 1 Comment (A)    Result 2 Comment   Microscopic Examination  Result Value Ref Range   WBC, UA 11-30 (A) 0 -  5 /hpf   RBC, UA 3-10 (A) 0 -  2 /hpf   Epithelial Cells (non renal) 0-10 0 - 10 /hpf   Casts Present (A) None seen /lpf   Cast Type Hyaline casts N/A   Bacteria, UA Many (A) None seen/Few  Urinalysis, Complete  Result Value Ref Range   Specific Gravity, UA 1.020 1.005 - 1.030   pH, UA 5.5 5.0 - 7.5   Color, UA Amber (A) Yellow   Appearance Ur Cloudy (A) Clear   Leukocytes, UA 3+ (A) Negative   Protein, UA 2+ (A) Negative/Trace   Glucose, UA Negative Negative   Ketones, UA Negative Negative   RBC, UA 2+ (A) Negative   Bilirubin, UA Negative Negative   Urobilinogen, Ur 0.2 0.2 - 1.0 mg/dL   Nitrite, UA Positive (A) Negative   Microscopic Examination See below:     Pertinent Imaging: n/a  Assessment & Plan:  A 64 year old female with MS and neurogenic bladder managed by chronic indwelling suprapubic tube changed monthly by her husband. She also has a history of bladder stones remotely. Today, she does complain of symptoms possibly related to urinary tract infection.  1. Urinary tract infection associated with catheterization of urinary tract, initial encounter UA and urine culture sent today. Recommend treatment based on culture and sensitivity data. UTIs fairly infrequent but will  continue to monitor for frequency. Not crrently on any suppressive and biotics. - Urinalysis, Complete - CULTURE, URINE COMPREHENSIVE  2. Neurogenic bladder Managed by chronic indwelling suprapubic tube changed monthly. She does irrigate this catheter every few days as needed. I have recommended proceeding with cystoscopy as there is a very low risk of development of squamous cell carcinomatous associated with chronic indwelling catheters and feel that this should be done at least every few years. -Schedule, procedure discussed in detail   Return in about 6 weeks (around 02/27/2015) for cystoscopy.  Hollice Espy, MD  [facility] [address], [place], [place] [address] [phone]

## 2015-01-16 NOTE — Patient Instructions (Signed)
Cystoscopy       Cystoscopy is a procedure that is used to help your caregiver diagnose and sometimes treat conditions that affect your lower urinary tract. Your lower urinary tract includes your bladder and the tube through which urine passes from your bladder out of your body (urethra). Cystoscopy is performed with a thin, tube-shaped instrument (cystoscope). The cystoscope has lenses and a light at the end so that your caregiver can see inside your bladder. The cystoscope is inserted at the entrance of your urethra. Your caregiver guides it through your urethra and into your bladder. There are two main types of cystoscopy:   Flexible cystoscopy (with a flexible cystoscope).   Rigid cystoscopy (with a rigid cystoscope).  Cystoscopy may be recommended for many conditions, including:   Urinary tract infections.   Blood in your urine (hematuria).   Loss of bladder control (urinary incontinence) or overactive bladder.   Unusual cells found in a urine sample.   Urinary blockage.   Painful urination.  Cystoscopy may also be done to remove a sample of your tissue to be checked under a microscope (biopsy). It may also be done to remove or destroy bladder stones.   LET YOUR CAREGIVER KNOW ABOUT:   Allergies to food or medicine.   Medicines taken, including vitamins, herbs, eyedrops, over-the-counter medicines, and creams.   Use of steroids (by mouth or creams).   Previous problems with anesthetics or numbing medicines.   History of bleeding problems or blood clots.   Previous surgery.   Other health problems, including diabetes and kidney problems.   Possibility of pregnancy, if this applies.  PROCEDURE   The area around the opening to your urethra will be cleaned. A medicine to numb your urethra (local anesthetic) is used. If a tissue sample or stone is removed during the procedure, you may be given a medicine to make you sleep (general anesthetic).   Your caregiver will gently insert the tip of the cystoscope into your  urethra. The cystoscope will be slowly glided through your urethra and into your bladder. Sterile fluid will flow through the cystoscope and into your bladder. The fluid will expand and stretch your bladder. This gives your caregiver a better view of your bladder walls. The procedure lasts about 15-20 minutes.   AFTER THE PROCEDURE   If a local anesthetic is used, you will be allowed to go home as soon as you are ready. If a general anesthetic is used, you will be taken to a recovery area until you are stable. You may have temporary bleeding and burning on urination.   Document Released: 07/22/2000 Document Revised: 04/18/2012 Document Reviewed: 01/16/2012   Exit[employer]® Patient Information ©2015 [employer]. This information is not intended to replace advice given to you by your health care provider. Make sure you discuss any questions you have with your health care provider.

## 2015-01-18 ENCOUNTER — Encounter: Payer: Self-pay | Admitting: Urology

## 2015-01-19 ENCOUNTER — Telehealth: Payer: Self-pay | Admitting: Urology

## 2015-01-19 LAB — CULTURE, URINE COMPREHENSIVE

## 2015-01-19 NOTE — Telephone Encounter (Signed)
Spoke with Beth [name] in the lab and she has ordered sensitivities on this, the reason they were not originally done was due to the amount of growth over 3 were noted, but these will be added on per your request.

## 2015-01-19 NOTE — Telephone Encounter (Signed)
UCx grew >100K mixed.  Please call lab and have them speceiate with sensitivities.    She is symptomatic.

## 2015-01-21 LAB — ID 2

## 2015-01-22 ENCOUNTER — Telehealth: Payer: Self-pay

## 2015-01-22 LAB — ID 1

## 2015-01-22 LAB — CUSTOMER REQUEST URINE CULTURE

## 2015-01-22 LAB — SPECIMEN STATUS REPORT

## 2015-01-22 NOTE — Telephone Encounter (Signed)
Pt called wanting to know results of urine cx. Per Dr. Erlene Quan urine was sent to get speciated and for pt to call back next week. Pt voiced understanding. Cw,lpn

## 2015-01-28 ENCOUNTER — Telehealth: Payer: Self-pay | Admitting: Urology

## 2015-01-28 MED ORDER — CIPROFLOXACIN HCL 500 MG PO TABS: 500.0000 mg | ORAL_TABLET | Freq: Two times a day (BID) | ORAL | Status: DC

## 2015-01-28 NOTE — Telephone Encounter (Signed)
Per Dr. Erlene Quan patient was notified of urine culture results and script was sent in for Cipro 500mg  BID for 7 days. She was told to call after finishing abx if her symptoms were not any better.

## 2015-01-28 NOTE — Telephone Encounter (Signed)
Pt notified documented in previous phone message/SW

## 2015-01-28 NOTE — Telephone Encounter (Signed)
Patient grew 100 K pseudomonas (pansensitive) and other culture results are pending still.  Lets go ahead and start cipro 500 mg po bid x 7 and will let her know if we need to add additional abx.  Script sent to pharmacy.  Please make patient aware.

## 2015-02-16 IMAGING — CT CT ABD-PELV W/ CM
2 of 5 series · 17 of 46 positions shown, 19 images · IV contrast (agent unspecified)
Comparison: None.

CLINICAL DATA: Left lower quadrant pain, right lower quadrant pain
under deep inspiration. History of constipation.

EXAM:
CT ABDOMEN AND PELVIS WITH CONTRAST
TECHNIQUE: Multidetector CT imaging of the abdomen and pelvis was performed
using the standard protocol following bolus administration of
intravenous contrast.
CONTRAST:  100 mL Dsovue-BII

[Series 2: routine abd pel with · axial · 0.96mm/px · z∈[-362,+23]mm · 14 of 89 slices shown, 16 images]
[im 6/89  soft-tissue]
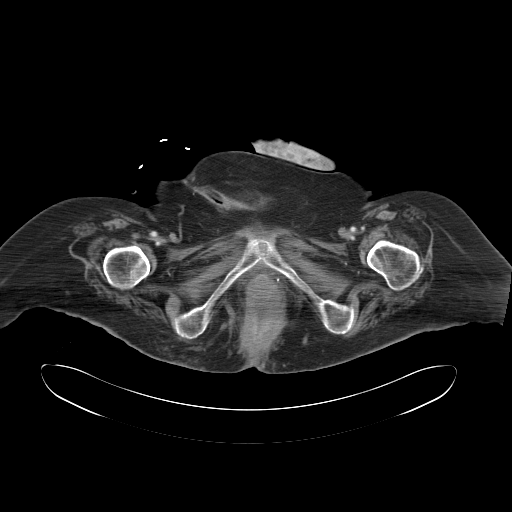
[im 6/89  bone]
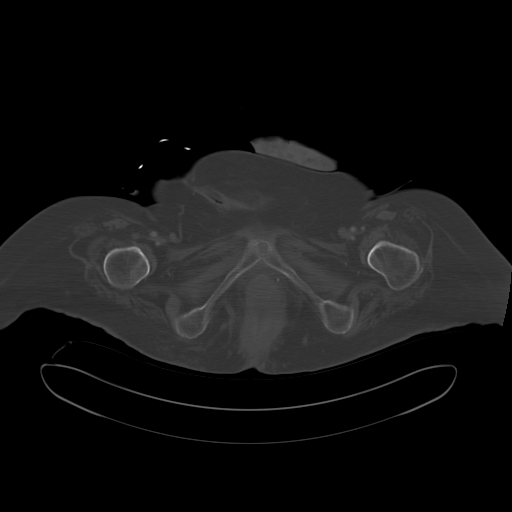
[im 11/89  soft-tissue]
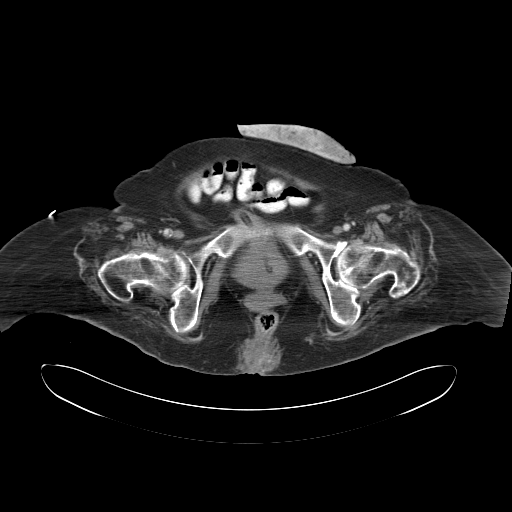
[im 16/89  soft-tissue]
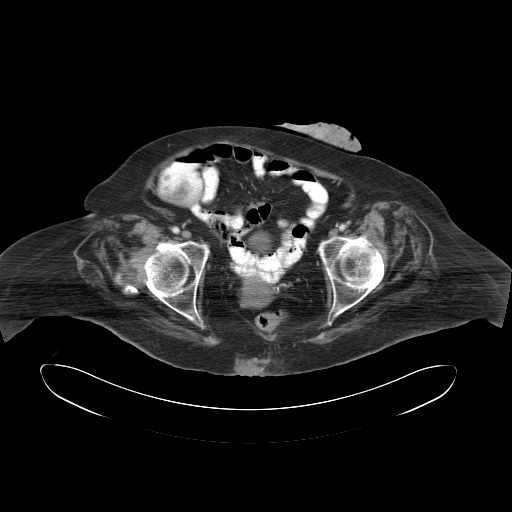
[im 26/89  soft-tissue]
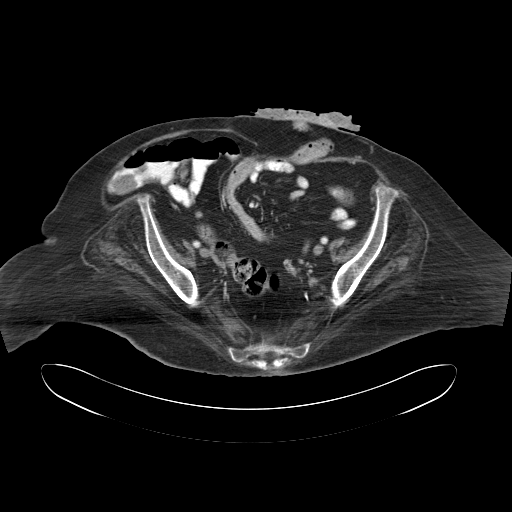
[im 32/89  soft-tissue]
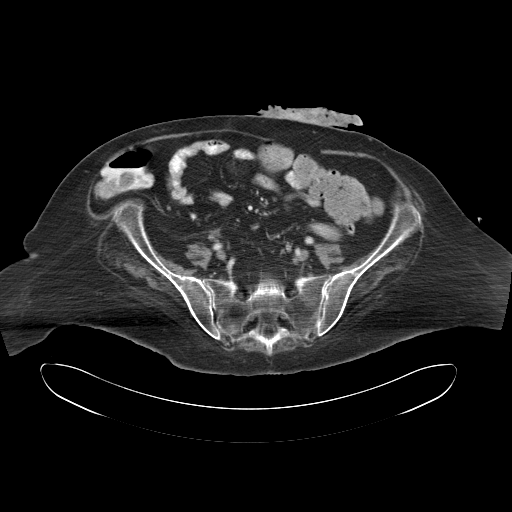
[im 37/89  soft-tissue]
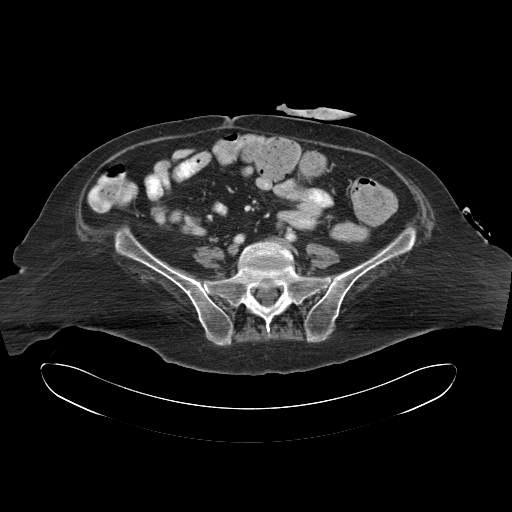
[im 42/89  soft-tissue]
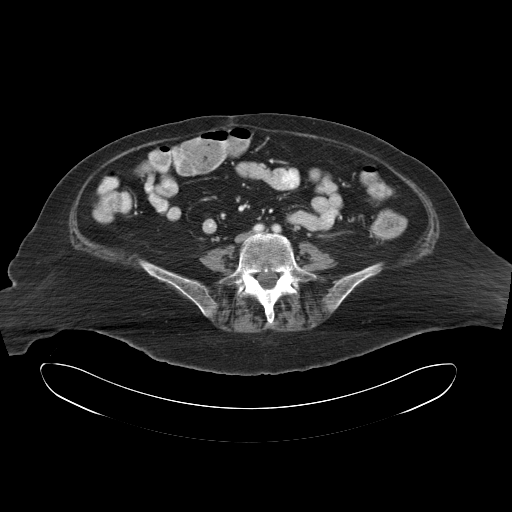
[im 47/89  soft-tissue]
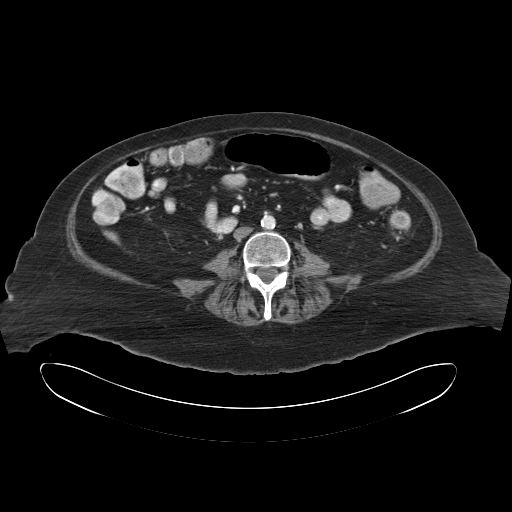
[im 52/89  soft-tissue]
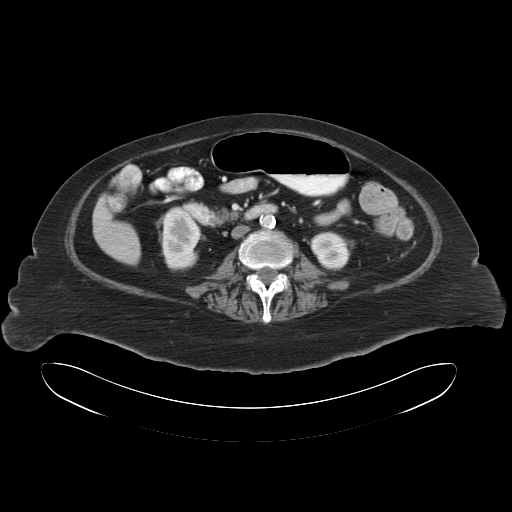
[im 52/89  bone]
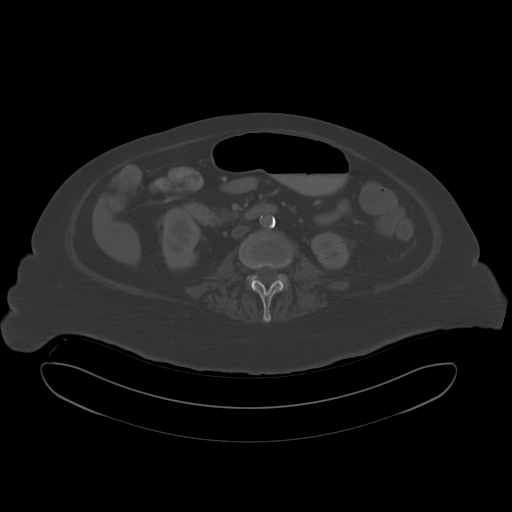
[im 57/89  soft-tissue]
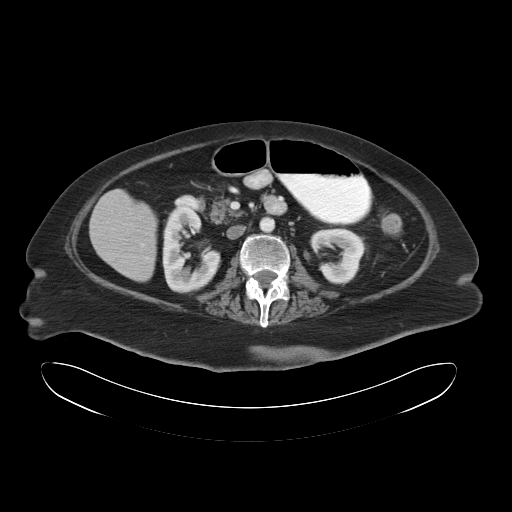
[im 68/89  soft-tissue]
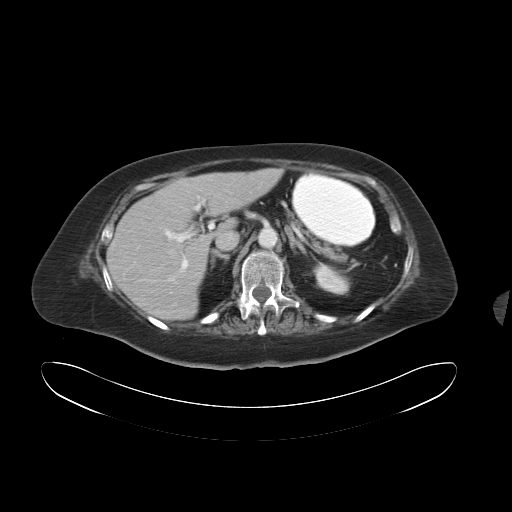
[im 73/89  soft-tissue]
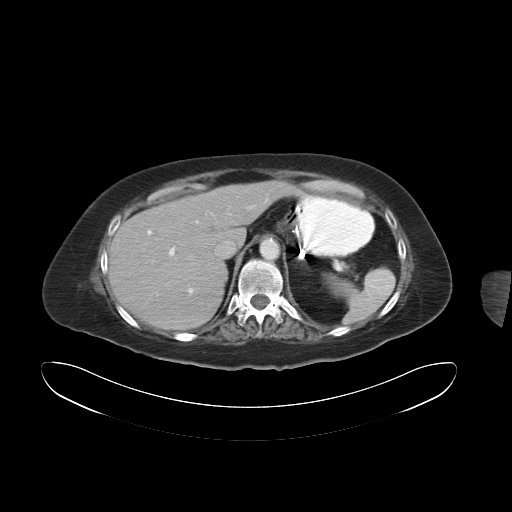
[im 78/89  soft-tissue]
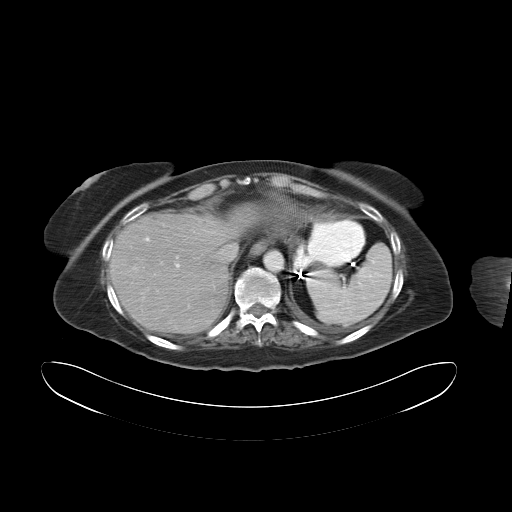
[im 83/89  soft-tissue]
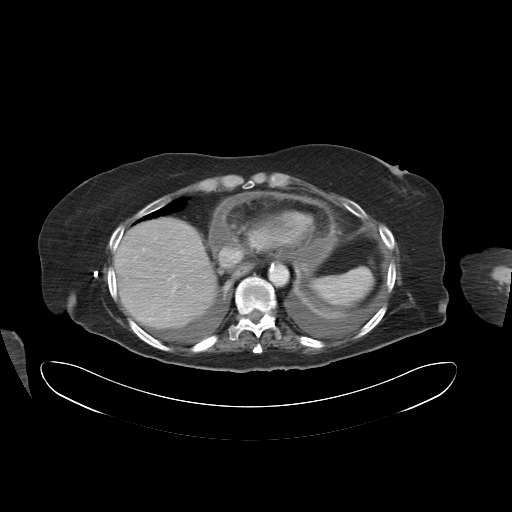

[Series 6: cor routine abd pel with · coronal · 0.89mm/px · 3 of 125 slices shown]
[im 42/125  soft-tissue]
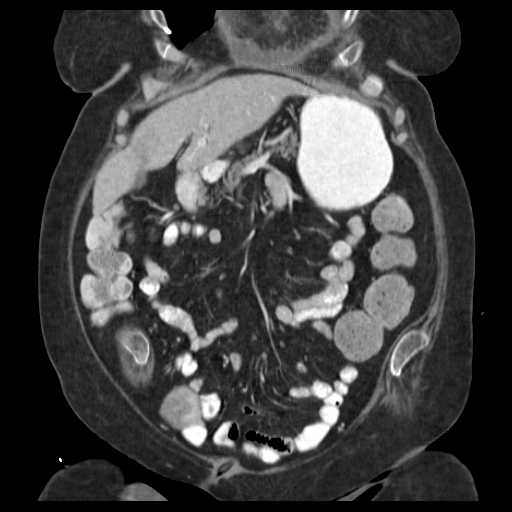
[im 56/125  soft-tissue]
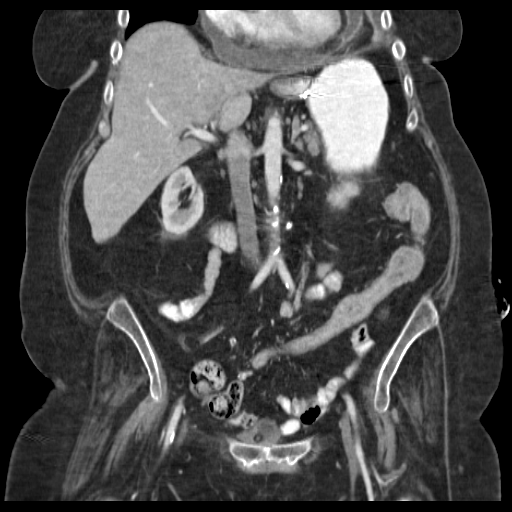
[im 69/125  soft-tissue]
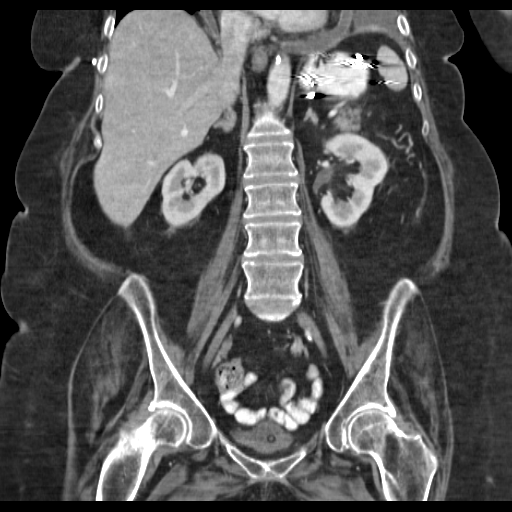

[17 of 46 positions shown; findings below may reference images not displayed]

FINDINGS: There are bilateral small pleural effusions, left greater than
right. There is associated compressive atelectasis bilaterally.
There is a small pericardial effusion measuring 17 mm of the level
of the intraventricular septum.

The liver demonstrates no focal abnormality. There is no
intrahepatic or extrahepatic biliary ductal dilatation. The
gallbladder is surgically absent. The spleen demonstrates no focal
abnormality. The kidneys, adrenal glands and pancreas are normal.
The bladder is decompressed with Foley catheter present.

The stomach, duodenum, small intestine, and large intestine
demonstrate no contrast extravasation or dilatation. There is a
moderate amount of stool in the transverse and descending colon.
There is a left lower quadrant colostomy. There are postsurgical
changes in the region of the rectum. There is no pneumoperitoneum,
pneumatosis, or portal venous gas. There is no abdominal or pelvic
free fluid. There is no lymphadenopathy.

The abdominal aorta is normal in caliber with atherosclerosis.

There are no lytic or sclerotic osseous lesions.
IMPRESSION: 1. Left lower quadrant colostomy. Moderate amount of stool in the
transverse and descending colon.
2. Small bilateral pleural effusions, left greater than right. Small
pericardial effusion measuring 17 mm at the level of the
intraventricular septum.

## 2015-02-27 ENCOUNTER — Ambulatory Visit (INDEPENDENT_AMBULATORY_CARE_PROVIDER_SITE_OTHER): Payer: Commercial Managed Care - HMO | Admitting: Urology

## 2015-02-27 VITALS — BP 121/83 | HR 73 | Ht 68.0 in | Wt 200.0 lb

## 2015-02-27 DIAGNOSIS — N319 Neuromuscular dysfunction of bladder, unspecified: Secondary | ICD-10-CM | POA: Diagnosis not present

## 2015-02-27 MED ORDER — CIPROFLOXACIN HCL 500 MG PO TABS
500.0000 mg | ORAL_TABLET | Freq: Once | ORAL | Status: AC
  Administered 2015-02-27: 500 mg via ORAL

## 2015-02-27 NOTE — Progress Notes (Signed)
12:51 PM   Beth [name] [birth year] [id number]  Referring provider: Dion Body, MD [name],  [id number]  Chief Complaint  Patient presents with  . Cysto    HPI:  64 yo F with MS, history of neurogenic bladder secondary to MS s/p SPT ~10 years ago.  She was previously followed by Dr.Nesi at [facility].     She developed bladder stones in 2013 s/p cystolithalopaxy.    Her husband changes her SPT monthly.    She reports having infections approximately twice per year and presented at last visit with symptoms of infection including increased. Catheter drainage, malaise, and other nonspecific symptoms.  Her urine culture grew 3 different organisms including pseudomonas. She was to the Cipro for a week.  Symptoms have since improved and she is feeling much better. Her urine is more clear now and she is having less spasms around the catheter.  We are still awaiting records from [place] [facility].  She presents today for office cystoscopy for surveillance.    PMH: Past Medical History  Diagnosis Date  . Shortness of breath     DIFFICULTY EXPECTORATING PHLEM  . Peripheral vascular disease   . MS (multiple sclerosis)     SINCE 1990  . Paralysis of both upper limbs     AND LOWER EXTREMITIES DUE TO MS  . Arthritis     L HAND  . Osteoporosis   . Rosacea   . Colostomy in place   . Nausea   . Bladder stone   . Suprapubic catheter   . UTI (lower urinary tract infection)     CHRONIC SINCE MARCH 2013  . Insomnia     TAKES SLEEPING MED NIGHTLY  . Depression   . Cancer     HX OF SKIN CANCER  . Pain of both eyes   . Swelling     FEET AND HANDS  . Sleep apnea     STOP BANG SCORE 4    Surgical History: Past Surgical History  Procedure Laterality Date  . Suprapubic catheter insertion  2002  . Colostomy  2005  . Gastric restriction surgery  1981  . Tubal ligation    . Cystoscopy  02/03/2012    Procedure: CYSTOSCOPY;  Surgeon:  Hanley Ben, MD;  Location: [facility];  Service: Urology;  Laterality: N/A;  Cystoscopy, Holmium Laser of Bladder Laser    Home Medications:    Medication List       This list is accurate as of: 02/27/15 12:51 PM.  Always use your most recent med list.               AMPYRA 10 MG Tb12  Generic drug:  dalfampridine  Take 10 mg by mouth 2 (two) times daily.     Cranberry 250 MG Caps  Take 1 capsule by mouth 2 (two) times daily.     cyclobenzaprine 5 MG tablet  Commonly known as:  FLEXERIL  Take 5 mg by mouth 3 (three) times daily as needed. For muscle spasms     docusate sodium 100 MG capsule  Commonly known as:  COLACE  Take 100 mg by mouth daily.     DULoxetine 30 MG capsule  Commonly known as:  CYMBALTA     DULoxetine 60 MG capsule  Commonly known as:  CYMBALTA  Take 60 mg by mouth daily with breakfast.     fish oil-omega-3 fatty acids 1000 MG capsule  Take 2 g by mouth  daily with breakfast.     furosemide 80 MG tablet  Commonly known as:  LASIX  Take 80 mg by mouth daily with breakfast.     ibuprofen 200 MG tablet  Commonly known as:  ADVIL,MOTRIN  Take 400 mg by mouth every 6 (six) hours as needed. For pain     pravastatin 20 MG tablet  Commonly known as:  PRAVACHOL     PRESCRIPTION MEDICATION  Place into both eyes 2 (two) times daily. Patient gets special eye drop made at [facility] with own blood called Blood Serum eye drops. 1 drop in both eyes twice a day.     raloxifene 60 MG tablet  Commonly known as:  EVISTA  Take 60 mg by mouth daily with breakfast.     RESTASIS 0.05 % ophthalmic emulsion  Generic drug:  cycloSPORINE     traZODone 50 MG tablet  Commonly known as:  DESYREL  Take 100 mg by mouth at bedtime.     vitamin C 1000 MG tablet  Take 1,000 mg by mouth daily.        Allergies: No Known Allergies  Family History: Family History  Problem Relation Age of Onset  . Heart attack Father   . Stroke Father   . Diabetes Father       Social History:  reports that she quit smoking about 11 years ago. She does not have any smokeless tobacco history on file. She reports that she drinks alcohol. She reports that she does not use illicit drugs.  Physical Exam: BP 121/83 mmHg  Pulse 73  Ht 5\' 8"  (1.727 m)  Wt 200 lb (90.719 kg)  BMI 30.42 kg/m2  Constitutional:  Alert and oriented, No acute distress.  Presents with husband today.  In wheelchair. HEENT: [place] AT, moist mucus membranes.  Trachea midline, no masses. Cardiovascular: Bilateral lower extremity edema with chronic stasis dermatitis. Respiratory: Normal respiratory effort, no increased work of breathing. GI: Abdomen is soft, nontender, nondistended, no abdominal masses.  Obese. Colostomy in place. GU: No CVA tenderness. SP tube site clean dry and intact.  Skin: No rashes, bruises or suspicious lesions. Neurologic: Grossly intact, no focal deficits, moving all 4 extremities.  Upper extremity atrophy bilaterally noted Psychiatric: Normal mood and affect.  Laboratory Data: Lab Results  Component Value Date   WBC 16.8* 12/09/2013   HGB 11.5* 12/09/2013   HCT 35.3 12/09/2013   MCV 90 12/09/2013   PLT 672* 12/09/2013    Lab Results  Component Value Date   CREATININE 0.83 12/09/2013      Cystoscopy Procedure Note  Patient identification was confirmed, informed consent was obtained, and patient was prepped using Betadine solution.  Her indwelling suprapubic tube was removed and the SP tube site tract was prepped using Betadine solution.  Preoperative abx where received prior to procedure.    Procedure: - Flexible cystoscope introduced via SPT tract, without any difficulty.   - Thorough search of the bladder revealed:    normal bladder neck and a proximal urethra    normal urothelium, area mild patch of erythema related to catheter cystitis    no stones or debris    no ulcers     no tumors    no urethral polyps    no trabeculation  - Ureteral  orifices were normal in position and appearance.  Post-Procedure: - Patient tolerated the procedure well   Assessment & Plan:  A 65 year old female with MS and neurogenic bladder managed by chronic indwelling suprapubic  tube.  Cystoscopy today showed a fairly small capacity but otherwise completely normal bladder. Visualization was excellent and there was no evidence of stones or debris. No tumors.  1. Neurogenic dysfunction of the urinary bladder - ciprofloxacin (CIPRO) tablet 500 mg; Take 1 tablet (500 mg total) by mouth once.  Recommend routine follow-up in 1-2 years or sooner as needed.    Hollice Espy, MD  [facility] [address] [place], [place] [address] [phone]

## 2015-03-12 ENCOUNTER — Telehealth: Payer: Self-pay

## 2015-03-12 NOTE — Telephone Encounter (Signed)
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 Made pt aware this syndrome does not require tx. Pt voiced understanding.

## 2015-03-12 NOTE — Telephone Encounter (Signed)
That is not an infection.  We discussed this prev  Hollice Espy, MD

## 2015-03-12 NOTE — Telephone Encounter (Signed)
Pt called this morning stating she feels as though she has an infection. Pt denies f/c, n/v, or lower back pain. Pt states her only symptom is her belly bag is now purple in color. Please advise.

## 2015-04-30 ENCOUNTER — Telehealth: Payer: Self-pay | Admitting: Urology

## 2015-04-30 NOTE — Telephone Encounter (Signed)
Pt called asking for approval sent from [place] to [facility] for her belly bags & colostomy bags for the month October.  Denzil Hughes JI#[id number].  Phone [phone].

## 2015-05-05 NOTE — Telephone Encounter (Signed)
Spoke with [employer] who stated they would fax over a PA for pt to have supplies 55mo at a time.

## 2015-05-07 ENCOUNTER — Telehealth: Payer: Self-pay

## 2015-05-07 NOTE — Telephone Encounter (Signed)
Pt requested SP tube supplies be authorized from Dr. Erlene Quan into [place]). Nurse attempted to the PA for supplies via insurance company([employer]). Anderson Malta from [place] called stating we can not submit the form Denzil Hughes would have to because they are seen as treating the patient. Nurse contacted Denzil Hughes, spoke with Candace, and made aware of Jennifer's message. Candace stated she would submit the information to the PA dept. Nurse provided Candace with Jennifer's name and [phone]. Pt has been made aware of everything.  [facility] number is [phone]

## 2015-05-13 ENCOUNTER — Encounter: Payer: Self-pay | Admitting: Obstetrics and Gynecology

## 2015-05-13 ENCOUNTER — Ambulatory Visit (INDEPENDENT_AMBULATORY_CARE_PROVIDER_SITE_OTHER): Payer: Commercial Managed Care - HMO | Admitting: Obstetrics and Gynecology

## 2015-05-13 VITALS — BP 129/81 | HR 90 | Temp 97.7°F | Resp 16

## 2015-05-13 DIAGNOSIS — R339 Retention of urine, unspecified: Secondary | ICD-10-CM

## 2015-05-13 DIAGNOSIS — N319 Neuromuscular dysfunction of bladder, unspecified: Secondary | ICD-10-CM | POA: Diagnosis not present

## 2015-05-13 LAB — MICROSCOPIC EXAMINATION
RBC, UA: NONE SEEN /hpf (ref 0–?)
RENAL EPITHEL UA: NONE SEEN /HPF

## 2015-05-13 LAB — URINALYSIS, COMPLETE
Bilirubin, UA: NEGATIVE
GLUCOSE, UA: NEGATIVE
Ketones, UA: NEGATIVE
Nitrite, UA: NEGATIVE
PH UA: 7.5 (ref 5.0–7.5)
PROTEIN UA: NEGATIVE
Specific Gravity, UA: 1.015 (ref 1.005–1.030)
Urobilinogen, Ur: 0.2 mg/dL (ref 0.2–1.0)

## 2015-05-13 NOTE — Progress Notes (Signed)
05/13/2015 3:05 PM   Beth [name] 13-Sep-1950 [id number]  Referring provider: Dion Body, MD [facility] [place], [place] [id number]  Chief Complaint  Patient presents with  . Urinary Tract Infection    HPI: Vision is a 64 year old female presenting today with complaints of right flank pain and increased urinary sediment noted in urine bag.  No fevers.  She reports that she does take Myrbetriq prn in for bladder spasms.  Reports good fluid intake.   02/27/15 Cystoscopy showing a fairly small capacity but otherwise completely normal bladder. Visualization was excellent and there was no evidence of stones or debris's. No tumors  Previous Urologic History: 64 yo F with MS, history of neurogenic bladder secondary to MS s/p SPT ~10 years ago. She was previously followed by Beth [name] at [facility].   She developed bladder stones in 2013 s/p cystolithalopaxy.   Her husband changes her SPT monthly.   She reports having infections approximately twice per year and presented at last visit with symptoms of infection including increased. Catheter drainage, malaise, and other nonspecific symptoms. Her urine culture grew 3 different organisms including pseudomonas. She was to the Cipro for a week. Symptoms have since improved and she is feeling much better. Her urine is more clear now and she is having less spasms around the catheter.  PMH: Past Medical History  Diagnosis Date  . Shortness of breath     DIFFICULTY EXPECTORATING PHLEM  . Peripheral vascular disease ([place])   . MS (multiple sclerosis) ([place])     Beth [name]  . Paralysis of both upper limbs ([place])     AND LOWER EXTREMITIES DUE TO MS  . Arthritis     L HAND  . Osteoporosis   . Rosacea   . Colostomy in place [facility])   . Nausea   . Bladder stone   . Suprapubic catheter ([place])   . UTI (lower urinary tract infection)     CHRONIC SINCE MARCH 2013  . Insomnia     TAKES SLEEPING MED  NIGHTLY  . Depression   . Cancer ([facility])     HX OF SKIN CANCER  . Pain of both eyes   . Swelling     FEET AND HANDS  . Sleep apnea     STOP BANG SCORE 4    Surgical History: Past Surgical History  Procedure Laterality Date  . Suprapubic catheter insertion  2002  . Colostomy  2005  . Gastric restriction surgery  1981  . Tubal ligation    . Cystoscopy  02/03/2012    Procedure: CYSTOSCOPY;  Surgeon: Beth Ben, MD;  Location: [facility];  Service: Urology;  Laterality: N/A;  Cystoscopy, Holmium Laser of Bladder Laser    Home Medications:    Medication List       This list is accurate as of: 05/13/15  3:05 PM.  Always use your most recent med list.               Cranberry 250 MG Caps  Take 1 capsule by mouth 2 (two) times daily.     cyclobenzaprine 5 MG tablet  Commonly known as:  FLEXERIL  Take 5 mg by mouth 3 (three) times daily as needed. For muscle spasms     docusate sodium 100 MG capsule  Commonly known as:  COLACE  Take 100 mg by mouth daily.     DULoxetine 30 MG capsule  Commonly known as:  CYMBALTA     DULoxetine  60 MG capsule  Commonly known as:  CYMBALTA  Take 60 mg by mouth daily with breakfast.     furosemide 80 MG tablet  Commonly known as:  LASIX  Take 80 mg by mouth daily with breakfast.     ibuprofen 200 MG tablet  Commonly known as:  ADVIL,MOTRIN  Take 400 mg by mouth every 6 (six) hours as needed. For pain     pravastatin 20 MG tablet  Commonly known as:  PRAVACHOL     RESTASIS 0.05 % ophthalmic emulsion  Generic drug:  cycloSPORINE     traZODone 50 MG tablet  Commonly known as:  DESYREL  Take 100 mg by mouth at bedtime.     vitamin C 1000 MG tablet  Take 1,000 mg by mouth daily.        Allergies: No Known Allergies  Family History: Family History  Problem Relation Age of Onset  . Heart attack Father   . Stroke Father   . Diabetes Father     Social History:  reports that she quit smoking about 11 years ago. She does not  have any smokeless tobacco history on file. She reports that she drinks alcohol. She reports that she does not use illicit drugs.  ROS: UROLOGY Frequent Urination?: No Hard to postpone urination?: No Burning/pain with urination?: Yes Get up at night to urinate?: No Leakage of urine?: Yes Urine stream starts and stops?: No Trouble starting stream?: No Do you have to strain to urinate?: No Blood in urine?: No Urinary tract infection?: Yes Sexually transmitted disease?: No Injury to kidneys or bladder?: No Painful intercourse?: No Weak stream?: No Currently pregnant?: No Vaginal bleeding?: No Last menstrual period?: n  Gastrointestinal Nausea?: No Vomiting?: No Indigestion/heartburn?: No Diarrhea?: No Constipation?: No  Constitutional Fever: No Night sweats?: No Weight loss?: No Fatigue?: Yes  Skin Skin rash/lesions?: No Itching?: No  Eyes Blurred vision?: No Double vision?: No  Ears/Nose/Throat Sore throat?: No Sinus problems?: No  Hematologic/Lymphatic Swollen glands?: No Easy bruising?: Yes  Cardiovascular Leg swelling?: Yes Chest pain?: No  Respiratory Cough?: No Shortness of breath?: No  Endocrine Excessive thirst?: No  Musculoskeletal Back pain?: Yes Joint pain?: No  Neurological Headaches?: No Dizziness?: Yes  Psychologic Depression?: No Anxiety?: No  Physical Exam: BP 129/81 mmHg  Pulse 90  Temp(Src) 97.7 F (36.5 C)  Resp 16  Ht   Wt   Constitutional:  Alert and oriented, No acute distress. HEENT: [place] AT, moist mucus membranes.  Trachea midline, no masses. Cardiovascular: No clubbing, cyanosis, or edema. Respiratory: Normal respiratory effort, no increased work of breathing. GI: Abdomen is soft, nontender, nondistended, no abdominal masses, SP tube present lower abdomen without signs of superficial infection, purple coloration noted to patient's urine reservoir bag GU: No CVA tenderness. Skin: No rashes, bruises or  suspicious lesions. Lymph: No cervical or inguinal adenopathy. Neurologic: Grossly intact, no focal deficits, moving all 4 extremities. Psychiatric: Normal mood and affect.  Laboratory Data: Lab Results  Component Value Date   WBC 16.8* 12/09/2013   HGB 11.5* 12/09/2013   HCT 35.3 12/09/2013   MCV 90 12/09/2013   PLT 672* 12/09/2013    Lab Results  Component Value Date   CREATININE 0.83 12/09/2013    No results found for: PSA  No results found for: TESTOSTERONE  No results found for: HGBA1C  Urinalysis    Component Value Date/Time   COLORURINE Yellow 12/09/2013 2138   APPEARANCEUR Hazy 12/09/2013 2138   LABSPEC 1.006 12/09/2013  2138   PHURINE 6.0 12/09/2013 2138   GLUCOSEU Negative 01/16/2015 1144   GLUCOSEU Negative 12/09/2013 2138   HGBUR Negative 12/09/2013 2138   BILIRUBINUR Negative 01/16/2015 1144   BILIRUBINUR Negative 12/09/2013 2138   KETONESUR Negative 12/09/2013 2138   PROTEINUR Negative 12/09/2013 2138   NITRITE Positive* 01/16/2015 1144   NITRITE Negative 12/09/2013 2138   LEUKOCYTESUR 3+* 01/16/2015 1144   LEUKOCYTESUR Trace 12/09/2013 2138    Pertinent Imaging:   Assessment & Plan:A 64 year old female with MS and neurogenic bladder managed by chronic indwelling suprapubic tube.   1. 1. Neurogenic dysfunction of the urinary bladder- UA showing 6-10 white blood cells, amorphous sediment, crystals and few bacteria today otherwise unremarkable. Urine sent for culture. Will await urine culture results prior to initiating treatment. Advised patient and caregiver to monitor for fevers or other worsening symptoms. - Urinalysis, Complete  Return if symptoms worsen or fail to improve.  Herbert Moors, [facility] [address] [place], [place] [address] [phone]

## 2015-05-15 ENCOUNTER — Telehealth: Payer: Self-pay

## 2015-05-15 DIAGNOSIS — N390 Urinary tract infection, site not specified: Secondary | ICD-10-CM

## 2015-05-15 LAB — CULTURE, URINE COMPREHENSIVE

## 2015-05-15 MED ORDER — AMOXICILLIN 500 MG PO TABS: 500.0000 mg | ORAL_TABLET | Freq: Two times a day (BID) | ORAL | Status: AC

## 2015-05-15 NOTE — Telephone Encounter (Signed)
LMOM-medication called into pharmacy 

## 2015-05-15 NOTE — Telephone Encounter (Signed)
-----   Message from Roda Shutters, [place] sent at 05/15/2015  1:27 PM EDT ----- Please notify patient that her urine culture was positive. Then a prescription for amoxicillin 500 mg twice a day. Symptoms do not resolve she needs to return for repeat urine culture. Thanks

## 2015-05-15 NOTE — Telephone Encounter (Signed)
Ms. Beth [name] returned your call, please call again

## 2015-05-15 NOTE — Telephone Encounter (Signed)
Spoke with pt in reference to infection. Made aware abx were called into pharmacy. Pt voiced understanding.

## 2015-05-18 ENCOUNTER — Other Ambulatory Visit: Payer: Self-pay

## 2015-07-22 ENCOUNTER — Ambulatory Visit: Payer: Commercial Managed Care - HMO | Admitting: Urology

## 2015-07-28 ENCOUNTER — Ambulatory Visit: Payer: Commercial Managed Care - HMO | Admitting: Urology

## 2015-07-30 ENCOUNTER — Encounter: Payer: Self-pay | Admitting: *Deleted

## 2015-07-31 ENCOUNTER — Encounter: Payer: Self-pay | Admitting: Urology

## 2015-07-31 ENCOUNTER — Ambulatory Visit (INDEPENDENT_AMBULATORY_CARE_PROVIDER_SITE_OTHER): Payer: Commercial Managed Care - HMO | Admitting: Urology

## 2015-07-31 VITALS — BP 131/84 | HR 90 | Ht 68.0 in | Wt 200.0 lb

## 2015-07-31 DIAGNOSIS — R3129 Other microscopic hematuria: Secondary | ICD-10-CM

## 2015-07-31 DIAGNOSIS — N390 Urinary tract infection, site not specified: Secondary | ICD-10-CM | POA: Diagnosis not present

## 2015-07-31 DIAGNOSIS — N319 Neuromuscular dysfunction of bladder, unspecified: Secondary | ICD-10-CM | POA: Insufficient documentation

## 2015-07-31 HISTORY — DX: Other microscopic hematuria: R31.29

## 2015-07-31 LAB — MICROSCOPIC EXAMINATION: WBC, UA: NONE SEEN /hpf (ref 0–?)

## 2015-07-31 LAB — URINALYSIS, COMPLETE
BILIRUBIN UA: NEGATIVE
Glucose, UA: NEGATIVE
KETONES UA: NEGATIVE
NITRITE UA: NEGATIVE
SPEC GRAV UA: 1.015 (ref 1.005–1.030)
Urobilinogen, Ur: 0.2 mg/dL (ref 0.2–1.0)
pH, UA: 7 (ref 5.0–7.5)

## 2015-07-31 MED ORDER — AMOXICILLIN 875 MG PO TABS: 875.0000 mg | ORAL_TABLET | Freq: Two times a day (BID) | ORAL | Status: DC

## 2015-07-31 NOTE — Progress Notes (Signed)
07/31/2015 10:00 PM   Beth [name] 12-09-1950 [id number]  Referring provider: Dion Body, MD [facility] [place], [place] [address]  Chief Complaint  Patient presents with  . Recurrent UTI    HPI: Patient is a 64 year old Caucasian female who presents today complaining of a one-week history of suprapubic pain with bladder spasms. She is also been experiencing backaches, strong foul odor in her urine and sediment in her urine.  She states she has felt feverish, but her husband stated she did not have a fever. She has been having some chills and slight nausea. She denied any vomiting.  She has not experienced any diarrhea.  Her UA today is positive for 3-10 rbc's per high-power field and many bacteria per high-power field.  Previous Urologic History: 64 yo F with MS, history of neurogenic bladder secondary to MS s/p SPT ~10 years ago. She was previously followed by Dr.Nesi at [facility].She developed bladder stones in 2013 s/p cystolitholapaxy. Her husband changes her SPT monthly. 02/27/15 Cystoscopy showing a fairly small capacity but otherwise completely normal bladder. Visualization was excellent and there was no evidence of stones or debris's. No tumors.  She recently had a positive urine culture for enterococcus back in October and it was treated successfully with amoxicillin 500 mg twice a day.    PMH: Past Medical History  Diagnosis Date  . Shortness of breath     DIFFICULTY EXPECTORATING PHLEM  . Peripheral vascular disease ([place])   . MS (multiple sclerosis) ([place])     [place]  . Paralysis of both upper limbs ([place])     AND LOWER EXTREMITIES DUE TO MS  . Arthritis     L HAND  . Osteoporosis   . Rosacea   . Colostomy in place [facility])   . Nausea   . Bladder stone   . Suprapubic catheter ([place])   . UTI (lower urinary tract infection)     CHRONIC SINCE MARCH 2013  . Insomnia     TAKES SLEEPING MED NIGHTLY  . Depression    . Cancer (HCC)     HX OF SKIN CANCER  . Pain of both eyes   . Swelling     FEET AND HANDS  . Sleep apnea     STOP BANG SCORE 4  . Hypervitaminosis   . HLD (hyperlipidemia)   . Diabetes ([place])   . Chronic edema     Surgical History: Past Surgical History  Procedure Laterality Date  . Suprapubic catheter insertion  2002  . Colostomy  2005  . Gastric restriction surgery  1981  . Tubal ligation    . Cystoscopy  02/03/2012    Procedure: CYSTOSCOPY;  Surgeon: Hanley Ben, MD;  Location: [facility];  Service: Urology;  Laterality: N/A;  Cystoscopy, Holmium Laser of Bladder Laser    Home Medications:    Medication List       This list is accurate as of: 07/31/15 10:00 PM.  Always use your most recent med list.               amoxicillin 875 MG tablet  Commonly known as:  AMOXIL  Take 1 tablet (875 mg total) by mouth every 12 (twelve) hours.     buPROPion 100 MG tablet  Commonly known as:  WELLBUTRIN  Reported on 07/31/2015     Coconut Oil 1000 MG Caps  Take by mouth. Reported on 07/31/2015     Cranberry 250 MG Caps  Take 1 capsule by mouth 2 (two) times daily.     cyclobenzaprine 5 MG tablet  Commonly known as:  FLEXERIL  Take 5 mg by mouth 3 (three) times daily as needed. For muscle spasms     diphenhydrAMINE 25 mg capsule  Commonly known as:  BENADRYL  Take by mouth. Reported on 07/31/2015     docusate sodium 100 MG capsule  Commonly known as:  COLACE  Take 100 mg by mouth daily.     DULoxetine 60 MG capsule  Commonly known as:  CYMBALTA  Take 60 mg by mouth daily with breakfast.     furosemide 80 MG tablet  Commonly known as:  LASIX  Take 80 mg by mouth daily with breakfast.     ibuprofen 200 MG tablet  Commonly known as:  ADVIL,MOTRIN  Take 400 mg by mouth every 6 (six) hours as needed. For pain     MULTI-VITAMINS Tabs  Take by mouth. Reported on 07/31/2015     NON FORMULARY  ZQuil at bedtime to help sleep.     pravastatin 20 MG tablet    Commonly known as:  PRAVACHOL     PREMARIN vaginal cream  Generic drug:  conjugated estrogens  Reported on 07/31/2015     RESTASIS 0.05 % ophthalmic emulsion  Generic drug:  cycloSPORINE     traMADol 50 MG tablet  Commonly known as:  ULTRAM  TAKE ONE TO TWO TABLETS BY MOUTH THREE TIMES DAILY AS NEEDED FOR PAIN *DISCONTINUE GABAPENTIN*     traZODone 50 MG tablet  Commonly known as:  DESYREL  Take 100 mg by mouth at bedtime. Reported on 07/31/2015     vitamin C 1000 MG tablet  Take 1,000 mg by mouth daily. Reported on 07/31/2015        Allergies: No Known Allergies  Family History: Family History  Problem Relation Age of Onset  . Heart attack Father   . Stroke Father   . Diabetes Father   . Kidney disease Neg Hx   . Bladder Cancer Neg Hx     Social History:  reports that she quit smoking about 11 years ago. She does not have any smokeless tobacco history on file. She reports that she drinks alcohol. She reports that she does not use illicit drugs.  ROS: UROLOGY Frequent Urination?: No Hard to postpone urination?: No Burning/pain with urination?: No Get up at night to urinate?: No Leakage of urine?: No Urine stream starts and stops?: No Trouble starting stream?: No Do you have to strain to urinate?: No Blood in urine?: No Urinary tract infection?: Yes Sexually transmitted disease?: No Injury to kidneys or bladder?: No Painful intercourse?: No Weak stream?: No Currently pregnant?: No Vaginal bleeding?: No Last menstrual period?: n  Gastrointestinal Nausea?: No Vomiting?: No Indigestion/heartburn?: No Diarrhea?: No Constipation?: No  Constitutional Fever: No Night sweats?: No Weight loss?: No Fatigue?: Yes  Skin Skin rash/lesions?: No Itching?: No  Eyes Blurred vision?: No Double vision?: No  Ears/Nose/Throat Sore throat?: No Sinus problems?: No  Hematologic/Lymphatic Swollen glands?: No Easy bruising?: Yes  Cardiovascular Leg  swelling?: No Chest pain?: No  Respiratory Cough?: No Shortness of breath?: No  Endocrine Excessive thirst?: No  Musculoskeletal Back pain?: Yes Joint pain?: No  Neurological Headaches?: No Dizziness?: No  Psychologic Depression?: Yes Anxiety?: No  Physical Exam: BP 131/84 mmHg  Pulse 90  Ht 5\' 8"  (1.727 m)  Wt 200 lb (90.719 kg)  BMI 30.42 kg/m2  Constitutional: Well nourished. Alert  and oriented, No acute distress. HEENT: [place] AT, moist mucus membranes. Trachea midline, no masses. Cardiovascular: No clubbing, cyanosis, or edema. Respiratory: Normal respiratory effort, no increased work of breathing. Skin: No rashes, bruises or suspicious lesions. Lymph: No cervical or inguinal adenopathy. Neurologic: Grossly intact, no focal deficits, moving all 4 extremities. Psychiatric: Normal mood and affect.  Laboratory Data: Lab Results  Component Value Date   WBC 16.8* 12/09/2013   HGB 11.5* 12/09/2013   HCT 35.3 12/09/2013   MCV 90 12/09/2013   PLT 672* 12/09/2013    Lab Results  Component Value Date   CREATININE 0.83 12/09/2013    Urinalysis Results for orders placed or performed in visit on 07/31/15  Microscopic Examination  Result Value Ref Range   WBC, UA None seen 0 -  5 /hpf   RBC, UA 3-10 (A) 0 -  2 /hpf   Epithelial Cells (non renal) 0-10 0 - 10 /hpf   Crystals Present (A) N/A   Crystal Type Amorphous Sediment N/A   Mucus, UA Present (A) Not Estab.   Bacteria, UA Many (A) None seen/Few  Urinalysis, Complete  Result Value Ref Range   Specific Gravity, UA 1.015 1.005 - 1.030   pH, UA 7.0 5.0 - 7.5   Color, UA Yellow Yellow   Appearance Ur Turbid (A) Clear   Leukocytes, UA 3+ (A) Negative   Protein, UA 2+ (A) Negative/Trace   Glucose, UA Negative Negative   Ketones, UA Negative Negative   RBC, UA 3+ (A) Negative   Bilirubin, UA Negative Negative   Urobilinogen, Ur 0.2 0.2 - 1.0 mg/dL   Nitrite, UA Negative Negative   Microscopic Examination  See below:     Assessment & Plan:    1. UTI:   Patient is empirically started on amoxicillin. We'll adjust her antibiotic if appropriate as urine culture results become available.  She will follow-up in 3 weeks' time for recheck of her UA and symptoms.  - Urinalysis, Complete - CULTURE, URINE COMPREHENSIVE  2. Microscopic hematuria:   Patient's has microscopic hematuria on today's UA. We will continue to monitor. If it persists in the face of negative urine cultures, we will pursue a hematuria workup.  She will return in 3 weeks for recheck of her UA.  3. Neurogenic bladder:   Patient started her bladder is managed with an SPT which her husband changes monthly.  Return in about 3 weeks (around 08/21/2015) for UA and office visit .  Zara Council, [facility] [address] [place], [place] [address] [phone]

## 2015-08-04 ENCOUNTER — Telehealth: Payer: Self-pay | Admitting: Urology

## 2015-08-04 DIAGNOSIS — N390 Urinary tract infection, site not specified: Secondary | ICD-10-CM

## 2015-08-04 LAB — CULTURE, URINE COMPREHENSIVE

## 2015-08-04 MED ORDER — LEVOFLOXACIN 500 MG PO TABS: 500.0000 mg | ORAL_TABLET | Freq: Every day | ORAL | Status: AC

## 2015-08-04 NOTE — Telephone Encounter (Signed)
Per urine culture, need to add Levaquin to current ammox.    Hollice Espy, MD

## 2015-08-06 MED ORDER — LEVOFLOXACIN 500 MG PO TABS: 500.0000 mg | ORAL_TABLET | Freq: Every day | ORAL | Status: AC

## 2015-08-06 NOTE — Telephone Encounter (Signed)
LMOM

## 2015-08-06 NOTE — Telephone Encounter (Signed)
Spoke with pt and made aware of abx.

## 2015-08-21 ENCOUNTER — Encounter: Payer: Self-pay | Admitting: Urology

## 2015-08-21 ENCOUNTER — Ambulatory Visit (INDEPENDENT_AMBULATORY_CARE_PROVIDER_SITE_OTHER): Payer: Commercial Managed Care - HMO | Admitting: Urology

## 2015-08-21 VITALS — BP 119/82 | HR 81 | Ht 68.0 in

## 2015-08-21 DIAGNOSIS — N3289 Other specified disorders of bladder: Secondary | ICD-10-CM

## 2015-08-21 DIAGNOSIS — N319 Neuromuscular dysfunction of bladder, unspecified: Secondary | ICD-10-CM

## 2015-08-21 DIAGNOSIS — N390 Urinary tract infection, site not specified: Secondary | ICD-10-CM

## 2015-08-22 ENCOUNTER — Encounter: Payer: Self-pay | Admitting: Urology

## 2015-08-22 NOTE — Progress Notes (Signed)
08/21/15  Beth [name] 1951/04/25 [id number]  Referring provider: Dion Body, MD [facility] [place], [place] [address]  Chief Complaint  Patient presents with  . Hematuria    3wk    HPI:  65 yo F with MS, history of neurogenic bladder secondary to MS s/p SPT ~10 years ago.  She was previously followed by Dr.Nesi at [facility].     She developed bladder stones in 2013 s/p cystolithalopaxy.    Her husband changes her SPT monthly.    Surveillance cystoscopy was performed in 02/2015 which showed small capacity bladder was unremarkable.  She has been treated for approximately 3 urinary tract "infections" over the past 6 months, most recently 3 weeks ago.  She reports that she was having increased leakage around her 6 French suprapubic tube, foul-smelling cloudy urine, and lower abdominal/pelvic pain. The symptoms have since resolved after treatment.  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  She does complain today of increased leakage around her suprapubic tube site, especially at nighttime. She wears an abdominal bag which is above the level of her umbilicus throughout the day and night.   PMH: Past Medical History  Diagnosis Date  . Shortness of breath     DIFFICULTY EXPECTORATING PHLEM  . Peripheral vascular disease ([place])   . MS (multiple sclerosis) ([place])     [place]  . Paralysis of both upper limbs ([place])     AND LOWER EXTREMITIES DUE TO MS  . Arthritis     L HAND  . Osteoporosis   . Rosacea   . Colostomy in place [facility])   . Nausea   . Bladder stone   . Suprapubic catheter ([place])   . UTI (lower urinary tract infection)     CHRONIC SINCE MARCH 2013  . Insomnia     TAKES SLEEPING MED NIGHTLY  . Depression   . Cancer (HCC)     HX OF SKIN CANCER  . Pain of both eyes   . Swelling     FEET AND HANDS  . Sleep apnea     STOP BANG SCORE 4  . Hypervitaminosis   . HLD (hyperlipidemia)   . Diabetes ([place])   .  Chronic edema   . Can't get food down 12/12/2013  . Diabetes mellitus ([place]) 01/07/2013  . 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 Multiple sclerosis ([place]) 12/12/2013  . Acne erythematosa 12/23/2011  . Closed fracture of proximal tibia 08/13/2014  . Closed fracture of tibial plateau 11/06/2014  . Microscopic hematuria 07/31/2015    Surgical History: Past Surgical History  Procedure Laterality Date  . Suprapubic catheter insertion  2002  . Colostomy  2005  . Gastric restriction surgery  1981  . Tubal ligation    . Cystoscopy  02/03/2012    Procedure: CYSTOSCOPY;  Surgeon: Hanley Ben, MD;  Location: [facility];  Service: Urology;  Laterality: N/A;  Cystoscopy, Holmium Laser of Bladder Laser    Home Medications:    Medication List       This list is accurate as of: 08/21/15 11:59 PM.  Always use your most recent med list.               buPROPion 100 MG tablet  Commonly known as:  WELLBUTRIN  Reported on 07/31/2015     Coconut Oil 1000 MG Caps  Take by mouth. Reported on 08/21/2015     Cranberry 250 MG Caps  Take 1 capsule by mouth 2 (two) times daily.     cyclobenzaprine 5 MG tablet  Commonly known as:  FLEXERIL  Take 5 mg by mouth 3 (three) times daily as needed. For muscle spasms     diphenhydrAMINE 25 mg capsule  Commonly known as:  BENADRYL  Take by mouth. Reported on 08/21/2015     docusate sodium 100 MG capsule  Commonly known as:  COLACE  Take 100 mg by mouth daily.     DULoxetine 60 MG capsule  Commonly known as:  CYMBALTA  Take 60 mg by mouth daily with breakfast.     furosemide 80 MG tablet  Commonly known as:  LASIX  Take 80 mg by mouth daily with breakfast.     ibuprofen 200 MG tablet  Commonly known as:  ADVIL,MOTRIN  Take 400 mg by mouth every 6 (six) hours as needed. For pain     levofloxacin 500 MG tablet  Commonly known as:  LEVAQUIN  Take 1 tablet (500 mg total) by mouth daily.     levofloxacin 500  MG tablet  Commonly known as:  LEVAQUIN  Take 1 tablet (500 mg total) by mouth daily.     MULTI-VITAMINS Tabs  Take by mouth. Reported on 07/31/2015     NON FORMULARY  ZQuil at bedtime to help sleep.     pravastatin 20 MG tablet  Commonly known as:  PRAVACHOL     PREMARIN vaginal cream  Generic drug:  conjugated estrogens  Reported on 07/31/2015     RESTASIS 0.05 % ophthalmic emulsion  Generic drug:  cycloSPORINE     traMADol 50 MG tablet  Commonly known as:  ULTRAM  TAKE ONE TO TWO TABLETS BY MOUTH THREE TIMES DAILY AS NEEDED FOR PAIN *DISCONTINUE GABAPENTIN*     traZODone 50 MG tablet  Commonly known as:  DESYREL  Take 100 mg by mouth at bedtime. Reported on 07/31/2015     vitamin C 1000 MG tablet  Take 1,000 mg by mouth daily. Reported on 07/31/2015        Allergies: No Known Allergies  Family History: Family History  Problem Relation Age of Onset  . Heart attack Father   . Stroke Father   . Diabetes Father   . Kidney disease Neg Hx   . Bladder Cancer Neg Hx     Social History:  reports that she quit smoking about 11 years ago. She does not have any smokeless tobacco history on file. She reports that she drinks alcohol. She reports that she does not use illicit drugs.  Physical Exam: BP 119/82 mmHg  Pulse 81  Ht 5\' 8"  (1.727 m)  Wt   Constitutional:  Alert and oriented, No acute distress.  Presents with husband today.  In wheelchair. HEENT: [place] AT, moist mucus membranes.  Trachea midline, no masses. Cardiovascular: Bilateral lower extremity edema with chronic stasis dermatitis. Respiratory: Normal respiratory effort, no increased work of breathing. GI: Abdomen is soft, nontender, nondistended, no abdominal masses.  Obese. Colostomy in place. GU: No CVA tenderness. SP tube site clean dry and intact. Abdominal drainage bag purple in color. Skin: No rashes, bruises or suspicious lesions. Neurologic: Grossly intact, no focal deficits, moving all 4  extremities.  Upper extremity atrophy bilaterally noted Psychiatric: Normal mood and affect.  Laboratory Data: Lab Results  Component Value Date   WBC 16.8* 12/09/2013   HGB 11.5* 12/09/2013   HCT 35.3 12/09/2013   MCV 90 12/09/2013   PLT 672* 12/09/2013  Lab Results  Component Value Date   CREATININE 0.83 12/09/2013     Assessment & Plan:   1. Neurogenic bladder Managed with 22 French suprapubic tube  Last cystoscopy 02/2015  2. Bladder spasm Recommend against upsizing suprapubic tube, currently 18 [place] which is quite large Bladder spasms mostly at night time, suspect given the location of her bag, her bladder is not completely draining. Advised switching to nighttime bag to be placed lower than the body for optimal bladder drainage Mybetriq as needed for bladder spasms Consideration of Botox in the future, discussed briefly with the patient but currently uninterested  3. Recurrent UTI We had a lengthy discussion again today about the difference between bacterial colonization and a true urinary tract infection Prefer to avoid antibiotic treatment as much as possible and only treat for true infections with abdominal pain, fevers, and other urinary specific symptoms   Follow-up in 1 year or sooner as needed  Hollice Espy, MD  [facility] [address] [place], [place] [address] [phone]

## 2015-08-27 ENCOUNTER — Telehealth: Payer: Self-pay | Admitting: Urology

## 2015-08-27 NOTE — Telephone Encounter (Signed)
Pt called and asked that [place] to get authorization for her medical supplies (insertion tube, insertion tray, cath tube, belly bags, and colostomy bags) that she needs.  The # for [facility] is [phone].

## 2015-09-02 ENCOUNTER — Telehealth: Payer: Self-pay

## 2015-09-02 NOTE — Telephone Encounter (Signed)
Pt called inquiring about PA for medical supplies. Nurse made pt and husband aware [facility] has been attempting to get the PA done for over a week. Pt provided nurse another number to call. Nurse called the number provided. [place] representative stated [facility] could not get PA completed due to being a specialist facility, PCP had to complete PA. Requested [facility] call pt and make aware of information. Nurse called pt back and made aware of [facility]'s information. Pt and husband voiced understanding. Pt stated she would call PCP.

## 2015-09-02 NOTE — Telephone Encounter (Signed)
Pt's husband called and stated that they contacted Dr. Raylene Miyamoto office and Dr. Raylene Miyamoto office told the patient and her husband to have [facility] call their office.  They stated that Dr. Raylene Miyamoto office is not responsible for putting in the order for Beth [name]'s supplies and that Dr. Cherrie Gauze office is responsible for that.  Please call Dr. Raylene Miyamoto office and call the patient.

## 2015-09-03 NOTE — Telephone Encounter (Signed)
Spoke with Dr. Raylene Miyamoto nurse who stated she would handle taking care of colostomy supplies for pt.

## 2015-09-04 ENCOUNTER — Telehealth: Payer: Self-pay

## 2015-09-04 NOTE — Telephone Encounter (Signed)
[facility] called stating pt needs a PA for ostomy supplies. [facility] provided [employer]'s number as [phone]. Spoke with Luiz Blare with [facility] call reference number [id number]. Roselyn Reef stated no PA is needed for pt to receive medical supplies. Per Roselyn Reef PCP needs to provide a written order and referral.  [facility]- for pt about conversation with Roselyn Reef.

## 2015-09-07 ENCOUNTER — Telehealth: Payer: Self-pay

## 2015-09-07 NOTE — Telephone Encounter (Signed)
Spoke with Caren Macadam from [place] who stated a form needs to be filled out, signed, and sent back. Caren Macadam is supposed to be faxing over needed form. Will have Dr. Erlene Quan sign and fax back.

## 2016-05-12 ENCOUNTER — Ambulatory Visit: Payer: Commercial Managed Care - HMO | Admitting: Obstetrics and Gynecology

## 2016-07-05 ENCOUNTER — Telehealth: Payer: Self-pay | Admitting: Urology

## 2016-07-05 NOTE — Telephone Encounter (Signed)
Just an FYI patient will not be coming back she has moved to [facility]

## 2016-08-23 ENCOUNTER — Ambulatory Visit: Payer: Commercial Managed Care - HMO | Admitting: Urology

## 2018-02-05 DEATH — deceased
